# Patient Record
Sex: Female | Born: 1985 | Race: White | Hispanic: No | Marital: Married | Smoking: Never smoker
Health system: Southern US, Community
[De-identification: ages and names within clinical notes are randomized; demographics above are authoritative.]

## PROBLEM LIST (undated history)

## (undated) DIAGNOSIS — G4730 Sleep apnea, unspecified: Secondary | ICD-10-CM

## (undated) DIAGNOSIS — K760 Fatty (change of) liver, not elsewhere classified: Secondary | ICD-10-CM

## (undated) DIAGNOSIS — R002 Palpitations: Secondary | ICD-10-CM

## (undated) DIAGNOSIS — K219 Gastro-esophageal reflux disease without esophagitis: Secondary | ICD-10-CM

## (undated) DIAGNOSIS — K900 Celiac disease: Secondary | ICD-10-CM

## (undated) DIAGNOSIS — E282 Polycystic ovarian syndrome: Secondary | ICD-10-CM

## (undated) DIAGNOSIS — Z973 Presence of spectacles and contact lenses: Secondary | ICD-10-CM

## (undated) DIAGNOSIS — K5792 Diverticulitis of intestine, part unspecified, without perforation or abscess without bleeding: Secondary | ICD-10-CM

## (undated) DIAGNOSIS — K029 Dental caries, unspecified: Secondary | ICD-10-CM

## (undated) HISTORY — PX: DILATION AND CURETTAGE OF UTERUS: SHX78

## (undated) HISTORY — PX: KIDNEY SURGERY: SHX687

## (undated) HISTORY — PX: HERNIA REPAIR: SHX51

## (undated) HISTORY — PX: CHOLECYSTECTOMY: SHX55

---

## 2017-08-24 ENCOUNTER — Encounter (HOSPITAL_BASED_OUTPATIENT_CLINIC_OR_DEPARTMENT_OTHER): Payer: Self-pay

## 2017-08-24 ENCOUNTER — Emergency Department (HOSPITAL_BASED_OUTPATIENT_CLINIC_OR_DEPARTMENT_OTHER): Payer: No Typology Code available for payment source

## 2017-08-24 ENCOUNTER — Emergency Department (HOSPITAL_BASED_OUTPATIENT_CLINIC_OR_DEPARTMENT_OTHER)
Admission: EM | Admit: 2017-08-24 | Discharge: 2017-08-25 | Disposition: A | Discharge status: Home / Self Care | Payer: No Typology Code available for payment source | Attending: Emergency Medicine | Admitting: Emergency Medicine

## 2017-08-24 ENCOUNTER — Other Ambulatory Visit: Payer: Self-pay

## 2017-08-24 DIAGNOSIS — R1084 Generalized abdominal pain: Secondary | ICD-10-CM | POA: Diagnosis present

## 2017-08-24 DIAGNOSIS — R079 Chest pain, unspecified: Secondary | ICD-10-CM | POA: Diagnosis not present

## 2017-08-24 DIAGNOSIS — K429 Umbilical hernia without obstruction or gangrene: Secondary | ICD-10-CM

## 2017-08-24 HISTORY — DX: Diverticulitis of intestine, part unspecified, without perforation or abscess without bleeding: K57.92

## 2017-08-24 HISTORY — DX: Sleep apnea, unspecified: G47.30

## 2017-08-24 HISTORY — DX: Celiac disease: K90.0

## 2017-08-24 HISTORY — DX: Morbid (severe) obesity due to excess calories: E66.01

## 2017-08-24 HISTORY — DX: Fatty (change of) liver, not elsewhere classified: K76.0

## 2017-08-24 HISTORY — DX: Gastro-esophageal reflux disease without esophagitis: K21.9

## 2017-08-24 LAB — PREGNANCY, URINE: Preg Test, Ur: NEGATIVE

## 2017-08-24 LAB — CBC WITH DIFFERENTIAL/PLATELET
Basophils Absolute: 0 10*3/uL (ref 0.0–0.1)
Basophils Relative: 0 %
Eosinophils Absolute: 0.3 10*3/uL (ref 0.0–0.7)
Eosinophils Relative: 2 %
HEMATOCRIT: 39.5 % (ref 36.0–46.0)
Hemoglobin: 12.6 g/dL (ref 12.0–15.0)
LYMPHS PCT: 23 %
Lymphs Abs: 3.1 10*3/uL (ref 0.7–4.0)
MCH: 28.9 pg (ref 26.0–34.0)
MCHC: 31.9 g/dL (ref 30.0–36.0)
MCV: 90.6 fL (ref 78.0–100.0)
MONOS PCT: 6 %
Monocytes Absolute: 0.8 10*3/uL (ref 0.1–1.0)
Neutro Abs: 9 10*3/uL — ABNORMAL HIGH (ref 1.7–7.7)
Neutrophils Relative %: 69 %
Platelets: 461 10*3/uL — ABNORMAL HIGH (ref 150–400)
RBC: 4.36 MIL/uL (ref 3.87–5.11)
RDW: 14.9 % (ref 11.5–15.5)
WBC: 13.3 10*3/uL — ABNORMAL HIGH (ref 4.0–10.5)

## 2017-08-24 LAB — URINALYSIS, ROUTINE W REFLEX MICROSCOPIC
Bilirubin Urine: NEGATIVE
GLUCOSE, UA: NEGATIVE mg/dL
KETONES UR: NEGATIVE mg/dL
Nitrite: NEGATIVE
PH: 6.5 (ref 5.0–8.0)
Protein, ur: NEGATIVE mg/dL
Specific Gravity, Urine: 1.015 (ref 1.005–1.030)

## 2017-08-24 LAB — COMPREHENSIVE METABOLIC PANEL
ALBUMIN: 3.3 g/dL — AB (ref 3.5–5.0)
ALT: 19 U/L (ref 14–54)
AST: 21 U/L (ref 15–41)
Alkaline Phosphatase: 67 U/L (ref 38–126)
Anion gap: 9 (ref 5–15)
BILIRUBIN TOTAL: 0.3 mg/dL (ref 0.3–1.2)
BUN: 13 mg/dL (ref 6–20)
CHLORIDE: 102 mmol/L (ref 101–111)
CO2: 27 mmol/L (ref 22–32)
Calcium: 9.3 mg/dL (ref 8.9–10.3)
Creatinine, Ser: 0.63 mg/dL (ref 0.44–1.00)
GFR calc Af Amer: >60 mL/min (ref >60)
GFR calc non Af Amer: >60 mL/min (ref >60)
GLUCOSE: 101 mg/dL — AB (ref 65–99)
POTASSIUM: 4.1 mmol/L (ref 3.5–5.1)
Sodium: 138 mmol/L (ref 135–145)
Total Protein: 7.5 g/dL (ref 6.5–8.1)

## 2017-08-24 LAB — D-DIMER, QUANTITATIVE: D-Dimer, Quant: <0.27 ug/mL-FEU (ref 0.00–0.50)

## 2017-08-24 LAB — TROPONIN I: Troponin I: <0.03 ng/mL (ref <0.03)

## 2017-08-24 LAB — URINALYSIS, MICROSCOPIC (REFLEX)

## 2017-08-24 LAB — LIPASE, BLOOD: Lipase: 20 U/L (ref 11–51)

## 2017-08-24 MED ORDER — FENTANYL CITRATE (PF) 100 MCG/2ML IJ SOLN
50.0000 ug | Freq: Once | INTRAMUSCULAR | Status: AC
  Administered 2017-08-24: 50 ug via INTRAVENOUS
  Filled 2017-08-24: qty 2

## 2017-08-24 MED ORDER — MORPHINE SULFATE (PF) 4 MG/ML IV SOLN
4.0000 mg | Freq: Once | INTRAVENOUS | Status: AC
  Administered 2017-08-24: 4 mg via INTRAVENOUS
  Filled 2017-08-24: qty 1

## 2017-08-24 MED ORDER — SODIUM CHLORIDE 0.9 % IV BOLUS (SEPSIS)
1000.0000 mL | Freq: Once | INTRAVENOUS | Status: AC
  Administered 2017-08-24: 1000 mL via INTRAVENOUS

## 2017-08-24 NOTE — ED Notes (Signed)
Patient transported to X-ray 

## 2017-08-24 NOTE — ED Triage Notes (Signed)
C/o abd pain started yesterday-denies n/v/d-denies urinary sx-also c/o CP x 2 months-pt states she is on second heart monitor-NAD-steady gait

## 2017-08-24 NOTE — ED Provider Notes (Signed)
[facility] EMERGENCY DEPARTMENT Provider Note   CSN: [id number] Arrival date & time: 08/24/17  2007     History   Chief Complaint Chief Complaint  Patient presents with  . Abdominal Pain    HPI Teshara Moree is a 32 y.o. female past medical history of celiac, diverticulitis, GERD, fatty liver who presents for evaluation of abdominal pain and chest pain.  Patient reports that chest pain has been an ongoing issue for the last several months.  She states that she has been evaluated by cardiologist and has been put on a monitor for evaluation of any abnormalities.  Patient reports that she most recently has had some chest tightness that began today. No associated diaphoresis or SOB.  Patient also reports abdominal pain that began 2 days ago.  She states that the abdominal pain is all over and states that it feels like "someone is gutting me."  She states that she has taken ibuprofen with minimal improvement.  She is also attempted to take Tums with no improvement in pain.  She does state that pain is worsened after eating.  She denies any other alleviating or aggravating factors.  Patient reports that she has felt nauseous but denies any vomiting.  Patient reports that she has had some episodes of diarrhea but denies any blood present in the stool.  Patient states that she recently had a ultrasound that told her she had an enlarged fallopian tube.  Patient denies any fevers, dysuria, hematuria.  The history is provided by the patient.    Past Medical History:  Diagnosis Date  . Celiac disease   . Diverticulitis   . Fatty liver   . GERD (gastroesophageal reflux disease)   . Morbid obesity ([place])   . Sleep apnea     There are no active problems to display for this patient.   Past Surgical History:  Procedure Laterality Date  . CESAREAN SECTION    . KIDNEY SURGERY      OB History    No data available       Home Medications    Prior to Admission medications     Medication Sig Start Date End Date Taking? Authorizing Provider  Omeprazole (PRILOSEC PO) Take by mouth.   Yes [provider]    Family History No family history on file.  Social History Social History   Tobacco Use  . Smoking status: Never Smoker  . Smokeless tobacco: Never Used  Substance Use Topics  . Alcohol use: No    Frequency: Never  . Drug use: No     Allergies   Gluten meal   Review of Systems Review of Systems  Constitutional: Negative for fever.  Respiratory: Negative for cough and shortness of breath.   Cardiovascular: Positive for chest pain.  Gastrointestinal: Positive for abdominal pain, diarrhea and nausea. Negative for blood in stool and vomiting.  Genitourinary: Negative for dysuria and hematuria.  Neurological: Negative for headaches.     Physical Exam Updated Vital Signs BP (!) 158/111   Pulse 81   Temp 98.2 F (36.8 C) (Oral)   Resp (!) 22   Ht 5' 1"  (1.549 m)   Wt (!) 145.2 kg (320 lb)   LMP 08/10/2017   SpO2 96%   BMI 60.46 kg/m   Physical Exam  Constitutional: She is oriented to person, place, and time. She appears well-developed and well-nourished.  Appears uncomfortable but no acute distress   HENT:  Head: Normocephalic and atraumatic.  Mouth/Throat: Oropharynx  is clear and moist and mucous membranes are normal.  Eyes: Conjunctivae, EOM and lids are normal. Pupils are equal, round, and reactive to light.  Neck: Full passive range of motion without pain.  Cardiovascular: Normal rate, regular rhythm, normal heart sounds and normal pulses. Exam reveals no gallop and no friction rub.  No murmur heard. Pulses:      Radial pulses are 2+ on the right side, and 2+ on the left side.  Pulmonary/Chest: Effort normal and breath sounds normal.  No evidence of respiratory distress. Able to speak in full sentences without difficulty.  Abdominal: Soft. Normal appearance and bowel sounds are normal. She exhibits no distension and no  mass. There is generalized tenderness. There is no rigidity, no guarding, no CVA tenderness, no tenderness at McBurney's point and negative Murphy's sign. No hernia.  Abdomen is soft, nondistended.  No mass or hernia. No rigidity or guarding.  Patient does have some generalized tenderness most notably to the epigastric and right upper quadrant, though no Murphy sign.  No McBurney's point tenderness.  No CVA tenderness bilaterally.  Musculoskeletal: Normal range of motion.  Neurological: She is alert and oriented to person, place, and time.  Skin: Skin is warm and dry. Capillary refill takes less than 2 seconds.  Psychiatric: She has a normal mood and affect. Her speech is normal.  Nursing note and vitals reviewed.    ED Treatments / Results  Labs (all labs ordered are listed, but only abnormal results are displayed) Labs Reviewed  URINALYSIS, ROUTINE W REFLEX MICROSCOPIC - Abnormal; Notable for the following components:      Result Value   Hgb urine dipstick SMALL (*)    Leukocytes, UA TRACE (*)    All other components within normal limits  URINALYSIS, MICROSCOPIC (REFLEX) - Abnormal; Notable for the following components:   Bacteria, UA RARE (*)    Squamous Epithelial / LPF 0-5 (*)    All other components within normal limits  COMPREHENSIVE METABOLIC PANEL - Abnormal; Notable for the following components:   Glucose, Bld 101 (*)    Albumin 3.3 (*)    All other components within normal limits  CBC WITH DIFFERENTIAL/PLATELET - Abnormal; Notable for the following components:   WBC 13.3 (*)    Platelets 461 (*)    Neutro Abs 9.0 (*)    All other components within normal limits  PREGNANCY, URINE  LIPASE, BLOOD  TROPONIN I  D-DIMER, QUANTITATIVE (NOT AT [facility])    EKG  EKG Interpretation  Date/Time:  Tuesday August 24 2017 20:25:58 EST Ventricular Rate:  112 PR Interval:  152 QRS Duration: 98 QT Interval:  320 QTC Calculation: 436 R Axis:   75 Text Interpretation:  Sinus  tachycardia Incomplete right bundle branch block Borderline ECG No old tracing to compare Confirmed by Delora Fuel ([id number]) on 08/24/2017 11:23:15 PM       Radiology Dg Chest 2 View  Result Date: 08/25/2017 CLINICAL DATA:  Left-sided chest pain for 1 day. EXAM: CHEST  2 VIEW COMPARISON:  None. FINDINGS: The cardiomediastinal contours are normal. The lungs are clear. Pulmonary vasculature is normal. No consolidation, pleural effusion, or pneumothorax. No acute osseous abnormalities are seen. IMPRESSION: No acute pulmonary process. Electronically Signed   By: Jeb Levering M.D.   On: 08/25/2017 00:12    Procedures Procedures (including critical care time)  Medications Ordered in ED Medications  ondansetron (ZOFRAN) 4 MG/2ML injection (not administered)  sodium chloride 0.9 % bolus 1,000 mL (0 mLs Intravenous Stopped  08/25/17 0002)  morphine 4 MG/ML injection 4 mg (4 mg Intravenous Given 08/24/17 2305)  fentaNYL (SUBLIMAZE) injection 50 mcg (50 mcg Intravenous Given 08/24/17 2345)  HYDROmorphone (DILAUDID) injection 1 mg (1 mg Intravenous Given 08/25/17 0100)  ondansetron (ZOFRAN) injection 4 mg (4 mg Intravenous Given 08/25/17 0100)     Initial Impression / Assessment and Plan / ED Course  I have reviewed the triage vital signs and the nursing notes.  Pertinent labs & imaging results that were available during my care of the patient were reviewed by me and considered in my medical decision making (see chart for details).     32 y.o. F past medical history of GERD, diverticulitis, celiac who presents for evaluation of 2 days of generalized abdominal pain.  Patient does not report any focal point of tenderness but "it hurts all over."  Associated with nausea.  No fevers, vomiting.  Also developed some chest tightness earlier today.  Patient reports that she has intermittent chest tightness that is been ongoing for the last 2 months.  She is being evaluated by cardiology.  Patient is afebrile,  non-toxic appearing. Appears uncomfortable but no acute distress. Vital signs reviewed and stable.  Consider acute infectious etiology versus diverticulitis.  Low suspicion for appendicitis given history/physical exam, though a consideration.  Do not suspect ovarian torsion given the duration of symptoms.  Low suspicion for ACS etiology but also consideration given complaints of chest pain.  Low suspicion for PE though patient is tachycardic and tachypneic.  Plan to check basic labs. Analgesics provided in the department. IVF given for fluid resuscitation.   RN informed me that patient was still having pain despite morphine.  Will give additional analgesics  Labs reviewed.  Troponin negative.  Lipase unremarkable.  CMP shows slight hyperglycemia but otherwise unremarkable.  D-dimer is negative.  CBC with slight leukocytosis but otherwise unremarkable.  UA shows some hemoglobin but otherwise no acute signs of infection.  Urine pregnancy is negative.  Chest x-ray negative for any acute infectious etiology.  Discussed results with patient.  Patient is still having significant pain after 2 rounds of analgesics.  On reevaluation, the pain is localized more to the left upper quadrant and left side.  Given concerns for continued pain despite analgesics, will plan for CT abdomen pelvis evaluation.  At this time, the CT scanner at our department is not working.  Given concerns that patient needs further imaging, will plan to transfer her to [facility] ED for further evaluation.  Patient had an episode of vomiting here in the department.  Will give additional analgesics and antiemetics.  Discussed with Dr. Stark Jock.  He will accept patient for transfer to [facility] long emergency department for further CT abdomen pelvis evaluation.  Updated patient on plan.  She is agreeable.   Final Clinical Impressions(s) / ED Diagnoses   Final diagnoses:  Generalized abdominal pain    ED Discharge Orders    None         Desma Mcgregor 08/25/17 0247    Fatima Blank, MD 08/26/17 1925

## 2017-08-25 ENCOUNTER — Emergency Department (HOSPITAL_COMMUNITY): Payer: No Typology Code available for payment source

## 2017-08-25 ENCOUNTER — Encounter (HOSPITAL_COMMUNITY): Payer: Self-pay

## 2017-08-25 ENCOUNTER — Encounter: Payer: Self-pay | Admitting: Gastroenterology

## 2017-08-25 MED ORDER — HYDROCODONE-ACETAMINOPHEN 5-325 MG PO TABS: 1.0000 | ORAL_TABLET | ORAL | 0 refills | Status: DC | PRN

## 2017-08-25 MED ORDER — HYDROMORPHONE HCL 1 MG/ML IJ SOLN
1.0000 mg | Freq: Once | INTRAMUSCULAR | Status: AC
  Administered 2017-08-25: 1 mg via INTRAVENOUS
  Filled 2017-08-25: qty 1

## 2017-08-25 MED ORDER — ONDANSETRON HCL 4 MG/2ML IJ SOLN
4.0000 mg | Freq: Once | INTRAMUSCULAR | Status: AC
  Administered 2017-08-25: 4 mg via INTRAVENOUS

## 2017-08-25 MED ORDER — ONDANSETRON 4 MG PO TBDP: 4.0000 mg | ORAL_TABLET | Freq: Three times a day (TID) | ORAL | 0 refills | Status: DC | PRN

## 2017-08-25 MED ORDER — ONDANSETRON HCL 4 MG/2ML IJ SOLN
INTRAMUSCULAR | Status: AC
  Filled 2017-08-25: qty 2

## 2017-08-25 MED ORDER — IOPAMIDOL (ISOVUE-300) INJECTION 61%
INTRAVENOUS | Status: AC
  Administered 2017-08-25: 100 mL
  Filled 2017-08-25: qty 100

## 2017-08-25 NOTE — Discharge Instructions (Signed)
Follow up with your GI doctor for further evaluation and management of abdominal pain.

## 2017-08-25 NOTE — ED Provider Notes (Signed)
[facility] abdominal pain. She started having generalized abdominal pain 2 days ago described as sharp/stabbing. No modifying factors. No fever. Nonbloody bowel movements. No urinary symptoms.   VSS on arrival here, patient's pain is controlled. No vomiting in route. No vomiting since arrival here. She is up to the bathroom without distress.   CT scan showing fat-containing umbilical hernia with small bowel and without SB inflammation or obstruction. Will refer to surgery. Symptoms appear controlled now. VS remain unchanged.      Elpidio AnisUpstill, Markeria Goetsch, PA-C 08/25/17 [id number]    Geoffery Lyonselo, [place], MD 08/25/17 [phone]

## 2017-09-24 ENCOUNTER — Encounter: Payer: Self-pay | Admitting: Gastroenterology

## 2017-09-24 ENCOUNTER — Ambulatory Visit (INDEPENDENT_AMBULATORY_CARE_PROVIDER_SITE_OTHER): Payer: No Typology Code available for payment source | Admitting: Gastroenterology

## 2017-09-24 ENCOUNTER — Encounter (INDEPENDENT_AMBULATORY_CARE_PROVIDER_SITE_OTHER): Payer: Self-pay

## 2017-09-24 VITALS — BP 132/80 | HR 82 | Ht 61.0 in | Wt 332.0 lb

## 2017-09-24 DIAGNOSIS — G43A0 Cyclical vomiting, not intractable: Secondary | ICD-10-CM | POA: Diagnosis not present

## 2017-09-24 DIAGNOSIS — R1115 Cyclical vomiting syndrome unrelated to migraine: Secondary | ICD-10-CM

## 2017-09-24 DIAGNOSIS — K8020 Calculus of gallbladder without cholecystitis without obstruction: Secondary | ICD-10-CM | POA: Diagnosis not present

## 2017-09-24 DIAGNOSIS — K429 Umbilical hernia without obstruction or gangrene: Secondary | ICD-10-CM | POA: Diagnosis not present

## 2017-09-24 DIAGNOSIS — R1084 Generalized abdominal pain: Secondary | ICD-10-CM

## 2017-09-24 NOTE — H&P (View-Only) (Signed)
[facility] Consult Note:  History: Paige [name] 09/24/2017  Referring physician: Self-referred  Reason for consult/chief complaint: Abdominal Pain (symptoms are better after vomiting undigested food. Sx have been for 2 months)   Subjective  HPI:  This is a 32 year old woman self referred prior to a recent emergency department visit.  She had had intermittent chest tightness for a few months that had apparently been evaluated by cardiology.  A month ago [facility] with continued chest pain but also acute onset generalized abdominal pain that has bothered her for 2 days with no relief.  She was then transferred to the hospital emergency department, underwent CT abdomen and pelvis that showed fat and small bowel containing umbilical hernia and she was referred to surgery.  Netha reports an underlying diagnosis of celiac sprue made several years ago by a GI physician in [place].  She has been on a gluten-free diet since then, which resolved the chronic diarrhea she was previously experiencing.  Regarding the abdominal pain, she reports it has been going on for about 2 months.  She describes it as acute onset and over the entire abdomen but sometimes more so on the left side.  When it occurs it is "on speed [place]" and then leads to vomiting of undigested food.  When the episodes resolve, she is back to her baseline without abdominal pain nausea or vomiting.  She reports having seen a Psychologist, sport and exercise in [place] in 2017 for hernia, and recalls being told they would not do the surgery due to her weight.  She says she was not having abdominal pain at that time however.+ She denies dysphagia or odynophagia weight change.  ROS:  Review of Systems  Constitutional: Negative for appetite change and unexpected weight change.  HENT: Negative for mouth sores and voice change.   Eyes: Negative for pain and redness.  Respiratory: Negative for cough and shortness  of breath.   Cardiovascular: Positive for leg swelling. Negative for chest pain and palpitations.       Chronic bilateral peripheral edema  Genitourinary: Negative for dysuria and hematuria.  Musculoskeletal: Negative for arthralgias and myalgias.  Skin: Negative for pallor and rash.  Neurological: Negative for weakness and headaches.  Hematological: Negative for adenopathy.     Past Medical History: Past Medical History:  Diagnosis Date  . Celiac disease   . Diverticulitis   . Fatty liver   . GERD (gastroesophageal reflux disease)   . Morbid obesity ([place])   . Sleep apnea      Past Surgical History: Past Surgical History:  Procedure Laterality Date  . CESAREAN SECTION    . KIDNEY SURGERY       Family History: Family History  Problem Relation Age of Onset  . Diabetes Mother   . Prostate cancer Father   . Diabetes Sister     Social History: Social History   Socioeconomic History  . Marital status: Married    Spouse name: None  . Number of children: None  . Years of education: None  . Highest education level: None  Social Needs  . Financial resource strain: None  . Food insecurity - worry: None  . Food insecurity - inability: None  . Transportation needs - medical: None  . Transportation needs - non-medical: None  Occupational History  . None  Tobacco Use  . Smoking status: Never Smoker  . Smokeless tobacco: Never Used  Substance and Sexual Activity  . Alcohol use: No  Frequency: Never  . Drug use: No  . Sexual activity: None  Other Topics Concern  . None  Social History Narrative  . None    Allergies: Allergies  Allergen Reactions  . Gluten Meal     Outpatient Meds: Current Outpatient Medications  Medication Sig Dispense Refill  . HYDROcodone-acetaminophen (NORCO/VICODIN) 5-325 MG tablet Take 1-2 tablets by mouth every 4 (four) hours as needed. 8 tablet 0  . metoprolol tartrate (LOPRESSOR) 25 MG tablet Take 25 mg by mouth 2 (two) times  daily.    . ondansetron (ZOFRAN ODT) 4 MG disintegrating tablet Take 1 tablet (4 mg total) by mouth every 8 (eight) hours as needed for nausea or vomiting. 20 tablet 0  . pantoprazole (PROTONIX) 40 MG tablet Take 40 mg by mouth daily.     No current facility-administered medications for this visit.       ___________________________________________________________________ Objective   Exam:  BP 132/80   Pulse 82   Ht 5' 1"  (1.549 m)   Wt (!) 332 lb (150.6 kg)   BMI 62.73 kg/m   She is accompanied by her mother for the entire encounter   General: this is a(n) morbidly obese woman in no acute distress  Eyes: sclera anicteric, no redness  ENT: oral mucosa moist without lesions, no cervical or supraclavicular lymphadenopathy, good dentition  CV: RRR without murmur, S1/S2, no JVD, no peripheral edema  Resp: clear to auscultation bilaterally, normal RR and effort noted  GI: soft, no tenderness, with active bowel sounds. No guarding or palpable organomegaly noted exam s significantly limited by BMI; no hernia felt.  Skin; warm and dry, no rash or jaundice noted  Neuro: awake, alert and oriented x 3. Normal gross motor function and fluent speech  Labs:  CBC Latest Ref Rng & Units 08/24/2017  WBC 4.0 - 10.5 K/uL 13.3(H)  Hemoglobin 12.0 - 15.0 g/dL 12.6  Hematocrit 36.0 - 46.0 % 39.5  Platelets 150 - 400 K/uL 461(H)   CMP Latest Ref Rng & Units 08/24/2017  Glucose 65 - 99 mg/dL 101(H)  BUN 6 - 20 mg/dL 13  Creatinine 0.44 - 1.00 mg/dL 0.63  Sodium 135 - 145 mmol/L 138  Potassium 3.5 - 5.1 mmol/L 4.1  Chloride 101 - 111 mmol/L 102  CO2 22 - 32 mmol/L 27  Calcium 8.9 - 10.3 mg/dL 9.3  Total Protein 6.5 - 8.1 g/dL 7.5  Total Bilirubin 0.3 - 1.2 mg/dL 0.3  Alkaline Phos 38 - 126 U/L 67  AST 15 - 41 U/L 21  ALT 14 - 54 U/L 19     Radiologic Studies:  CTAP 08/25/16 report:  IMPRESSION: 1. Moderate-sized umbilical hernia contains small bowel without associated small  bowel thickening or obstruction. There is mild stranding of the herniated fat which may reflect inflammatory change. 2. Hepatosplenomegaly and hepatic steatosis.  A small gallstone was also noted in report but not included in final impression  I personally reviewed the CT images and showed them to the patient. She and her mother were upset because they do not recall any results being conveyed to the mother than that "nothing was found". Assessment: Encounter Diagnoses  Name Primary?  . Non-intractable cyclical vomiting with nausea Yes  . Umbilical hernia without obstruction and without gangrene   . Generalized abdominal pain   . Calculus of gallbladder without cholecystitis without obstruction     Her symptoms sound most like intermittent obstruction from this umbilical hernia.  There is some small bowel and fat  contained in it with some minor stranding.  There was no proximal dilatation on the CT scan however.  I think less likely explanation is gastric outlet obstruction or other upper GI pathology. It does not sound typical for biliary colic, but that is still possible   Plan:  Upper endoscopy.  Must be done next week at the hospital endoscopy lab due to her BMI.  The benefits and risks of the planned procedure were described in detail with the patient or (when appropriate) their health care proxy.  Risks were outlined as including, but not limited to, bleeding, infection, perforation, adverse medication reaction leading to cardiac or pulmonary decompensation, or pancreatitis (if ERCP).  The limitation of incomplete mucosal visualization was also discussed.  No guarantees or warranties were given.  Patient at increased risk for cardiopulmonary complications of procedure due to medical comorbidities.  Referral to general surgery for consideration of hernia repair and possibly cholecystectomy as well.  She wished to see a different surgeon, so referral was placed to [facility]  surgery.  Thank you for the courtesy of this consult.  Please call me with any questions or concerns.  Nelida Meuse III  CC: Self-referred

## 2017-09-24 NOTE — Addendum Note (Signed)
Addended by: Nelida Meuse on: 09/24/2017 12:50 PM   Modules accepted: Level of Service

## 2017-09-24 NOTE — Progress Notes (Addendum)
[facility] Consult Note:  History: Paige [name] 09/24/2017  Referring physician: Self-referred  Reason for consult/chief complaint: Abdominal Pain (symptoms are better after vomiting undigested food. Sx have been for 2 months)   Subjective  HPI:  This is a 32 year old woman self referred prior to a recent emergency department visit.  She had had intermittent chest tightness for a few months that had apparently been evaluated by cardiology.  A month ago she [facility] [place] [facility] with continued chest pain but also acute onset generalized abdominal pain that has bothered her for 2 days with no relief.  She was then transferred to the hospital emergency department, underwent CT abdomen and pelvis that showed fat and small bowel containing umbilical hernia and she was referred to surgery.  Paige [name] reports an underlying diagnosis of celiac sprue made several years ago by a GI physician in [place].  She has been on a gluten-free diet since then, which resolved the chronic diarrhea she was previously experiencing.  Regarding the abdominal pain, she reports it has been going on for about 2 months.  She describes it as acute onset and over the entire abdomen but sometimes more so on the left side.  When it occurs it is "on speed [place]" and then leads to vomiting of undigested food.  When the episodes resolve, she is back to her baseline without abdominal pain nausea or vomiting.  She reports having seen a Psychologist, sport and exercise in [place] in 2017 for hernia, and recalls being told they would not do the surgery due to her weight.  She says she was not having abdominal pain at that time however.+ She denies dysphagia or odynophagia weight change.  ROS:  Review of Systems  Constitutional: Negative for appetite change and unexpected weight change.  HENT: Negative for mouth sores and voice change.   Eyes: Negative for pain and redness.  Respiratory: Negative for cough and shortness  of breath.   Cardiovascular: Positive for leg swelling. Negative for chest pain and palpitations.       Chronic bilateral peripheral edema  Genitourinary: Negative for dysuria and hematuria.  Musculoskeletal: Negative for arthralgias and myalgias.  Skin: Negative for pallor and rash.  Neurological: Negative for weakness and headaches.  Hematological: Negative for adenopathy.     Past Medical History: Past Medical History:  Diagnosis Date  . Celiac disease   . Diverticulitis   . Fatty liver   . GERD (gastroesophageal reflux disease)   . Morbid obesity ([place])   . Sleep apnea      Past Surgical History: Past Surgical History:  Procedure Laterality Date  . CESAREAN SECTION    . KIDNEY SURGERY       Family History: Family History  Problem Relation Age of Onset  . Diabetes Mother   . Prostate cancer Father   . Diabetes Sister     Social History: Social History   Socioeconomic History  . Marital status: Married    Spouse name: None  . Number of children: None  . Years of education: None  . Highest education level: None  Social Needs  . Financial resource strain: None  . Food insecurity - worry: None  . Food insecurity - inability: None  . Transportation needs - medical: None  . Transportation needs - non-medical: None  Occupational History  . None  Tobacco Use  . Smoking status: Never Smoker  . Smokeless tobacco: Never Used  Substance and Sexual Activity  . Alcohol use: No  Frequency: Never  . Drug use: No  . Sexual activity: None  Other Topics Concern  . None  Social History Narrative  . None    Allergies: Allergies  Allergen Reactions  . Gluten Meal     Outpatient Meds: Current Outpatient Medications  Medication Sig Dispense Refill  . HYDROcodone-acetaminophen (NORCO/VICODIN) 5-325 MG tablet Take 1-2 tablets by mouth every 4 (four) hours as needed. 8 tablet 0  . metoprolol tartrate (LOPRESSOR) 25 MG tablet Take 25 mg by mouth 2 (two) times  daily.    . ondansetron (ZOFRAN ODT) 4 MG disintegrating tablet Take 1 tablet (4 mg total) by mouth every 8 (eight) hours as needed for nausea or vomiting. 20 tablet 0  . pantoprazole (PROTONIX) 40 MG tablet Take 40 mg by mouth daily.     No current facility-administered medications for this visit.       ___________________________________________________________________ Objective   Exam:  BP 132/80   Pulse 82   Ht 5' 1"  (1.549 m)   Wt (!) 332 lb (150.6 kg)   BMI 62.73 kg/m   She is accompanied by her mother for the entire encounter   General: this is a(n) morbidly obese woman in no acute distress  Eyes: sclera anicteric, no redness  ENT: oral mucosa moist without lesions, no cervical or supraclavicular lymphadenopathy, good dentition  CV: RRR without murmur, S1/S2, no JVD, no peripheral edema  Resp: clear to auscultation bilaterally, normal RR and effort noted  GI: soft, no tenderness, with active bowel sounds. No guarding or palpable organomegaly noted exam s significantly limited by BMI; no hernia felt.  Skin; warm and dry, no rash or jaundice noted  Neuro: awake, alert and oriented x 3. Normal gross motor function and fluent speech  Labs:  CBC Latest Ref Rng & Units 08/24/2017  WBC 4.0 - 10.5 K/uL 13.3(H)  Hemoglobin 12.0 - 15.0 g/dL 12.6  Hematocrit 36.0 - 46.0 % 39.5  Platelets 150 - 400 K/uL 461(H)   CMP Latest Ref Rng & Units 08/24/2017  Glucose 65 - 99 mg/dL 101(H)  BUN 6 - 20 mg/dL 13  Creatinine 0.44 - 1.00 mg/dL 0.63  Sodium 135 - 145 mmol/L 138  Potassium 3.5 - 5.1 mmol/L 4.1  Chloride 101 - 111 mmol/L 102  CO2 22 - 32 mmol/L 27  Calcium 8.9 - 10.3 mg/dL 9.3  Total Protein 6.5 - 8.1 g/dL 7.5  Total Bilirubin 0.3 - 1.2 mg/dL 0.3  Alkaline Phos 38 - 126 U/L 67  AST 15 - 41 U/L 21  ALT 14 - 54 U/L 19     Radiologic Studies:  CTAP 08/25/16 report:  IMPRESSION: 1. Moderate-sized umbilical hernia contains small bowel without associated small  bowel thickening or obstruction. There is mild stranding of the herniated fat which may reflect inflammatory change. 2. Hepatosplenomegaly and hepatic steatosis.  A small gallstone was also noted in report but not included in final impression  I personally reviewed the CT images and showed them to the patient. She and her mother were upset because they do not recall any results being conveyed to the mother than that "nothing was found". Assessment: Encounter Diagnoses  Name Primary?  . Non-intractable cyclical vomiting with nausea Yes  . Umbilical hernia without obstruction and without gangrene   . Generalized abdominal pain   . Calculus of gallbladder without cholecystitis without obstruction     Her symptoms sound most like intermittent obstruction from this umbilical hernia.  There is some small bowel and fat  contained in it with some minor stranding.  There was no proximal dilatation on the CT scan however.  I think less likely explanation is gastric outlet obstruction or other upper GI pathology. It does not sound typical for biliary colic, but that is still possible   Plan:  Upper endoscopy.  Must be done next week at the hospital endoscopy lab due to her BMI.  The benefits and risks of the planned procedure were described in detail with the patient or (when appropriate) their health care proxy.  Risks were outlined as including, but not limited to, bleeding, infection, perforation, adverse medication reaction leading to cardiac or pulmonary decompensation, or pancreatitis (if ERCP).  The limitation of incomplete mucosal visualization was also discussed.  No guarantees or warranties were given.  Patient at increased risk for cardiopulmonary complications of procedure due to medical comorbidities.  Referral to general surgery for consideration of hernia repair and possibly cholecystectomy as well.  She wished to see a different surgeon, so referral was placed to [facility]  surgery.  Thank you for the courtesy of this consult.  Please call me with any questions or concerns.  Nelida Meuse III  CC: Self-referred

## 2017-09-24 NOTE — Patient Instructions (Signed)
If you are age 31 or older, your body mass index should be between 23-30. Your Body mass index is 62.73 kg/m. If this is out of the aforementioned range listed, please consider follow up with your Primary Care Provider.  If you are age 31 or younger, your body mass index should be between 19-25. Your Body mass index is 62.73 kg/m. If this is out of the aformentioned range listed, please consider follow up with your Primary Care Provider.   You have been scheduled for an endoscopy. Please follow written instructions given to you at your visit today. If you use inhalers (even only as needed), please bring them with you on the day of your procedure. Your physician has requested that you go to www.startemmi.com and enter the access code given to you at your visit today. This web site gives a general overview about your procedure. However, you should still follow specific instructions given to you by our office regarding your preparation for the procedure.  [facility].. Make certain to bring a list of current medications, including any over the counter medications or vitamins. Also bring your co-pay if you have one as well as your insurance cards. [facility] is located at [address], [address]. Should you need to reschedule your appointment, please contact them at [phone].  Thank you for choosing [facility]  Dr Amada JupiterHenry Danis III

## 2017-09-28 ENCOUNTER — Encounter (HOSPITAL_COMMUNITY)
Admission: RE | Disposition: A | Discharge status: Home / Self Care | Payer: Self-pay | Source: Ambulatory Visit | Attending: Gastroenterology

## 2017-09-28 ENCOUNTER — Ambulatory Visit (HOSPITAL_COMMUNITY): Payer: No Typology Code available for payment source | Admitting: Anesthesiology

## 2017-09-28 ENCOUNTER — Other Ambulatory Visit: Payer: Self-pay

## 2017-09-28 ENCOUNTER — Encounter (HOSPITAL_COMMUNITY): Payer: Self-pay | Admitting: *Deleted

## 2017-09-28 ENCOUNTER — Ambulatory Visit (HOSPITAL_COMMUNITY)
Admission: RE | Admit: 2017-09-28 | Discharge: 2017-09-28 | Disposition: A | Discharge status: Home / Self Care | Payer: No Typology Code available for payment source | Source: Ambulatory Visit | Attending: Gastroenterology | Admitting: Gastroenterology

## 2017-09-28 DIAGNOSIS — Z6841 Body Mass Index (BMI) 40.0 and over, adult: Secondary | ICD-10-CM | POA: Insufficient documentation

## 2017-09-28 DIAGNOSIS — R1110 Vomiting, unspecified: Secondary | ICD-10-CM

## 2017-09-28 DIAGNOSIS — R1010 Upper abdominal pain, unspecified: Secondary | ICD-10-CM | POA: Diagnosis not present

## 2017-09-28 DIAGNOSIS — K219 Gastro-esophageal reflux disease without esophagitis: Secondary | ICD-10-CM | POA: Diagnosis not present

## 2017-09-28 DIAGNOSIS — K429 Umbilical hernia without obstruction or gangrene: Secondary | ICD-10-CM

## 2017-09-28 DIAGNOSIS — K760 Fatty (change of) liver, not elsewhere classified: Secondary | ICD-10-CM | POA: Diagnosis not present

## 2017-09-28 DIAGNOSIS — Z79899 Other long term (current) drug therapy: Secondary | ICD-10-CM | POA: Insufficient documentation

## 2017-09-28 DIAGNOSIS — G4730 Sleep apnea, unspecified: Secondary | ICD-10-CM | POA: Diagnosis not present

## 2017-09-28 DIAGNOSIS — K900 Celiac disease: Secondary | ICD-10-CM | POA: Diagnosis not present

## 2017-09-28 DIAGNOSIS — R1115 Cyclical vomiting syndrome unrelated to migraine: Secondary | ICD-10-CM

## 2017-09-28 HISTORY — PX: ESOPHAGOGASTRODUODENOSCOPY: SHX5428

## 2017-09-28 SURGERY — EGD (ESOPHAGOGASTRODUODENOSCOPY)
Anesthesia: Monitor Anesthesia Care

## 2017-09-28 MED ORDER — LIDOCAINE 2% (20 MG/ML) 5 ML SYRINGE
INTRAMUSCULAR | Status: DC | PRN
  Administered 2017-09-28: 80 mg via INTRAVENOUS

## 2017-09-28 MED ORDER — LACTATED RINGERS IV SOLN
INTRAVENOUS | Status: DC
  Administered 2017-09-28: 09:00:00 via INTRAVENOUS

## 2017-09-28 MED ORDER — SODIUM CHLORIDE 0.9 % IV SOLN: INTRAVENOUS | Status: DC

## 2017-09-28 MED ORDER — PROPOFOL 10 MG/ML IV BOLUS
INTRAVENOUS | Status: AC
  Filled 2017-09-28: qty 20

## 2017-09-28 MED ORDER — KETAMINE HCL 10 MG/ML IJ SOLN
INTRAMUSCULAR | Status: DC | PRN
  Administered 2017-09-28 (×2): 10 mg via INTRAVENOUS

## 2017-09-28 MED ORDER — LACTATED RINGERS IV SOLN: INTRAVENOUS | Status: DC | PRN

## 2017-09-28 MED ORDER — KETAMINE HCL 10 MG/ML IJ SOLN
INTRAMUSCULAR | Status: AC
  Filled 2017-09-28: qty 1

## 2017-09-28 MED ORDER — PROPOFOL 10 MG/ML IV BOLUS
INTRAVENOUS | Status: DC | PRN
  Administered 2017-09-28 (×3): 50 mg via INTRAVENOUS

## 2017-09-28 NOTE — Anesthesia Preprocedure Evaluation (Addendum)
Anesthesia Evaluation  Patient identified by MRN, date of birth, ID band Patient awake    Reviewed: Allergy & Precautions, NPO status , Patient's Chart, lab work & pertinent test results  Airway Mallampati: II  TM Distance: >3 FB Neck ROM: Full    Dental no notable dental hx. (+) Dental Advisory Given, Chipped, Poor Dentition   Pulmonary sleep apnea and Continuous Positive Airway Pressure Ventilation ,    Pulmonary exam normal breath sounds clear to auscultation       Cardiovascular negative cardio ROS Normal cardiovascular exam Rhythm:Regular Rate:Normal     Neuro/Psych negative neurological ROS  negative psych ROS   GI/Hepatic negative GI ROS, Neg liver ROS,   Endo/Other  Morbid obesity  Renal/GU negative Renal ROS  negative genitourinary   Musculoskeletal negative musculoskeletal ROS (+)   Abdominal   Peds negative pediatric ROS (+)  Hematology negative hematology ROS (+)   Anesthesia Other Findings   Reproductive/Obstetrics negative OB ROS                           Anesthesia Physical Anesthesia Plan  ASA: III  Anesthesia Plan: MAC   Post-op Pain Management:    Induction: Intravenous  PONV Risk Score and Plan: 2 and Treatment may vary due to age or medical condition  Airway Management Planned: Nasal Cannula  Additional Equipment:   Intra-op Plan:   Post-operative Plan:   Informed Consent: I have reviewed the patients History and Physical, chart, labs and discussed the procedure including the risks, benefits and alternatives for the proposed anesthesia with the patient or authorized representative who has indicated his/her understanding and acceptance.   Dental advisory given  Plan Discussed with: CRNA  Anesthesia Plan Comments:         Anesthesia Quick Evaluation

## 2017-09-28 NOTE — H&P (Signed)
History:  This patient presents for endoscopic testing for abdominal pain, vomiting See office consult note from last week.  Debbra RidingHolly Glaze Referring physician: System, Pcp Not In  Past Medical History: Past Medical History:  Diagnosis Date  . Celiac disease   . Diverticulitis   . Fatty liver   . GERD (gastroesophageal reflux disease)   . Morbid obesity ([facility])   . Sleep apnea      Past Surgical History: Past Surgical History:  Procedure Laterality Date  . CESAREAN SECTION    . KIDNEY SURGERY      Allergies: Allergies  Allergen Reactions  . Gluten Meal Other (See Comments)    Celiac disease  . Latex Itching    Outpatient Meds: Current Facility-Administered Medications  Medication Dose Route Frequency Provider Last Rate Last Dose  . lactated ringers infusion   Intravenous Continuous Charlie Pitteranis, Alizee Maple L III, MD 20 mL/hr at 09/28/17 0919        ___________________________________________________________________ Objective   Exam:  BP (!) 142/99   Pulse 96   Temp 97.6 F (36.4 C) (Oral)   Resp 17   Ht 5\' 1"  (1.549 m)   Wt (!) 332 lb (150.6 kg)   LMP 09/24/2017 Comment: per dr Acey Lavcarignan ok with no pregnancy test  SpO2 98%   BMI 62.73 kg/m    CV: RRR without murmur, S1/S2, no JVD, no peripheral edema  Resp: clear to auscultation bilaterally, normal RR and effort noted  GI: soft,morbidly obese, no tenderness, with active bowel sounds. No guarding or palpable organomegaly noted.  Neuro: awake, alert and oriented x 3. Normal gross motor function and fluent speech   Assessment:  Abdominal pain, vomiting  Plan:  EGD   Charlie PitterHenry L Danis III

## 2017-09-28 NOTE — Interval H&P Note (Signed)
History and Physical Interval Note:  09/28/2017 10:25 AM  Paige [name]  has presented today for surgery, with the diagnosis of Vomiting  The various methods of treatment have been discussed with the patient and family. After consideration of risks, benefits and other options for treatment, the patient has consented to  Procedure(s): ESOPHAGOGASTRODUODENOSCOPY (EGD) (N/A) as a surgical intervention .  The patient's history has been reviewed, patient examined, no change in status, stable for surgery.  I have reviewed the patient's chart and labs.  Questions were answered to the patient's satisfaction.     Seerat Peaden L Danis III   

## 2017-09-28 NOTE — Discharge Instructions (Signed)
YOU HAD AN ENDOSCOPIC PROCEDURE TODAY: Refer to the procedure report and other information in the discharge instructions given to you for any specific questions about what was found during the examination. If this information does not answer your questions, please call [facility] office at [phone] to clarify.  ° °YOU SHOULD EXPECT: Some feelings of bloating in the abdomen. Passage of more gas than usual. Walking can help get rid of the air that was put into your GI tract during the procedure and reduce the bloating. If you had a lower endoscopy (such as a colonoscopy or flexible sigmoidoscopy) you may notice spotting of blood in your stool or on the toilet paper. Some abdominal soreness may be present for a day or two, also. ° °DIET: Your first meal following the procedure should be a light meal and then it is ok to progress to your normal diet. A half-sandwich or bowl of soup is an example of a good first meal. Heavy or fried foods are harder to digest and may make you feel nauseous or bloated. Drink plenty of fluids but you should avoid alcoholic beverages for 24 hours. If you had a esophageal dilation, please see attached instructions for diet.   ° °ACTIVITY: Your care partner should take you home directly after the procedure. You should plan to take it easy, moving slowly for the rest of the day. You can resume normal activity the day after the procedure however YOU SHOULD NOT DRIVE, use power tools, machinery or perform tasks that involve climbing or major physical exertion for 24 hours (because of the sedation medicines used during the test).  ° °SYMPTOMS TO REPORT IMMEDIATELY: °A gastroenterologist can be reached at any hour. Please call [phone]  for any of the following symptoms:  °Following lower endoscopy (colonoscopy, flexible sigmoidoscopy) °Excessive amounts of blood in the stool  °Significant tenderness, worsening of abdominal pains  °Swelling of the abdomen that is new, acute  °Fever of 100° or  higher  °Following upper endoscopy (EGD, EUS, ERCP, esophageal dilation) °Vomiting of blood or coffee ground material  °New, significant abdominal pain  °New, significant chest pain or pain under the shoulder blades  °Painful or persistently difficult swallowing  °New shortness of breath  °Black, tarry-looking or red, bloody stools ° °FOLLOW UP:  °If any biopsies were taken you will be contacted by phone or by letter within the next 1-3 weeks. Call [phone]  if you have not heard about the biopsies in 3 weeks.  °Please also call with any specific questions about appointments or follow up tests. ° °

## 2017-09-28 NOTE — Transfer of Care (Signed)
Immediate Anesthesia Transfer of Care Note  Patient: Paige [name]  Procedure(s) Performed: ESOPHAGOGASTRODUODENOSCOPY (EGD) (N/A )  Patient Location: PACU and Endoscopy Unit  Anesthesia Type:MAC  Level of Consciousness: awake and alert   Airway & Oxygen Therapy: Patient Spontanous Breathing and Patient connected to nasal cannula oxygen  Post-op Assessment: Report given to RN and Post -op Vital signs reviewed and stable  Post vital signs: Reviewed and stable  Last Vitals:  Vitals:   09/28/17 0910 09/28/17 1103  BP: (!) 142/99 133/81  Pulse: 96 97  Resp: 17 (!) 23  Temp: 36.4 C 36.6 C  SpO2: 98% 97%    Last Pain:  Vitals:   09/28/17 1103  TempSrc: Oral         Complications: No apparent anesthesia complications

## 2017-09-28 NOTE — Anesthesia Postprocedure Evaluation (Signed)
Anesthesia Post Note  Patient: Paige [name]  Procedure(s) Performed: ESOPHAGOGASTRODUODENOSCOPY (EGD) (N/A )     Patient location during evaluation: Endoscopy Anesthesia Type: MAC Level of consciousness: awake and alert Pain management: pain level controlled Vital Signs Assessment: post-procedure vital signs reviewed and stable Respiratory status: spontaneous breathing, nonlabored ventilation, respiratory function stable and patient connected to nasal cannula oxygen Cardiovascular status: stable and blood pressure returned to baseline Postop Assessment: no apparent nausea or vomiting Anesthetic complications: no    Last Vitals:  Vitals:   09/28/17 1110 09/28/17 1115  BP: 128/84 135/82  Pulse: 93 91  Resp: (!) 26 (!) 34  Temp:    SpO2: 100% 98%    Last Pain:  Vitals:   09/28/17 1103  TempSrc: Oral                 Montez Hageman

## 2017-09-28 NOTE — Anesthesia Procedure Notes (Signed)
Procedure Name: MAC Date/Time: 09/28/2017 10:40 AM Performed by: Sharlette Dense, CRNA Pre-anesthesia Checklist: Patient identified, Emergency Drugs available, Suction available, Patient being monitored and Timeout performed Patient Re-evaluated:Patient Re-evaluated prior to induction Oxygen Delivery Method: Nasal cannula Placement Confirmation: breath sounds checked- equal and bilateral and positive ETCO2 Dental Injury: Teeth and Oropharynx as per pre-operative assessment

## 2017-09-28 NOTE — Interval H&P Note (Signed)
History and Physical Interval Note:  09/28/2017 10:25 AM  Paige [name]  has presented today for surgery, with the diagnosis of Vomiting  The various methods of treatment have been discussed with the patient and family. After consideration of risks, benefits and other options for treatment, the patient has consented to  Procedure(s): ESOPHAGOGASTRODUODENOSCOPY (EGD) (N/A) as a surgical intervention .  The patient's history has been reviewed, patient examined, no change in status, stable for surgery.  I have reviewed the patient's chart and labs.  Questions were answered to the patient's satisfaction.     Nelida Meuse III

## 2017-09-28 NOTE — Op Note (Signed)
[facility] Patient Name: Paige [name] Procedure Date: 09/28/2017 MRN: [id number] Attending MD: Estill Cotta. Loletha Carrow , MD Date of Birth: [birth year] CSN: [id number] Age: 32 Admit Type: Outpatient Procedure:                Upper GI endoscopy Indications:              Upper abdominal pain, Vomiting (patient has a                            previous diagnosis of celiac sprue) Providers:                Estill Cotta. Loletha Carrow, MD, Cleda Daub, RN, William Dalton, Technician, Charolette Child, Technician Referring MD:              Medicines:                Monitored Anesthesia Care Complications:            No immediate complications. Estimated Blood Loss:     Estimated blood loss: none. Procedure:                Pre-Anesthesia Assessment:                           - Prior to the procedure, a History and Physical                            was performed, and patient medications and                            allergies were reviewed. The patient's tolerance of                            previous anesthesia was also reviewed. The risks                            and benefits of the procedure and the sedation                            options and risks were discussed with the patient.                            All questions were answered, and informed consent                            was obtained. Prior Anticoagulants: The patient has                            taken no previous anticoagulant or antiplatelet                            agents. ASA Grade Assessment: III - A patient with  severe systemic disease. After reviewing the risks                            and benefits, the patient was deemed in                            satisfactory condition to undergo the procedure.                           After obtaining informed consent, the endoscope was                            passed under direct vision. Throughout the              procedure, the patient's blood pressure, pulse, and                            oxygen saturations were monitored continuously. The                            ([id number]) [id number] was introduced through the                            mouth, and advanced to the second part of duodenum.                            The upper GI endoscopy was accomplished without                            difficulty. The patient tolerated the procedure. Findings:      The esophagus was normal.      The stomach was normal.      The cardia and gastric fundus were normal on retroflexion.      The examined duodenum was normal. Impression:               - Normal esophagus.                           - Normal stomach.                           - Normal examined duodenum. No gross endoscopic                            evidence of celiac sprue, most likely because                            patient is on a gluten free diet.                           - No specimens collected. Moderate Sedation:      MAC sedation used Recommendation:           - Patient has a contact number available for  emergencies. The signs and symptoms of potential                            delayed complications were discussed with the                            patient. Return to normal activities tomorrow.                            Written discharge instructions were provided to the                            patient.                           - Resume previous diet.                           - Continue present medications.                           - Surgical consultation as recently planned to                            evaluate umbilical hernia and gallstone. Procedure Code(s):        --- Professional ---                           [phone], Esophagogastroduodenoscopy, flexible,                            transoral; diagnostic, including collection of                            specimen(s) by brushing or  washing, when performed                            (separate procedure) Diagnosis Code(s):        --- Professional ---                           R10.10, Upper abdominal pain, unspecified                           R11.10, Vomiting, unspecified CPT copyright 2016 American Medical Association. All rights reserved. The codes documented in this report are preliminary and upon coder review may  be revised to meet current compliance requirements. Laiba Fuerte L. Loletha Carrow, MD 09/28/2017 10:54:37 AM This report has been signed electronically. Number of Addenda: 0

## 2017-09-29 ENCOUNTER — Encounter (HOSPITAL_COMMUNITY): Payer: Self-pay | Admitting: Gastroenterology

## 2019-01-24 IMAGING — DX DG CHEST 2V
2 series · 2 of 2 positions shown · non-contrast
Comparison: None.

CLINICAL DATA: Left-sided chest pain for 1 day.

EXAM:
CHEST  2 VIEW

[chest pa]
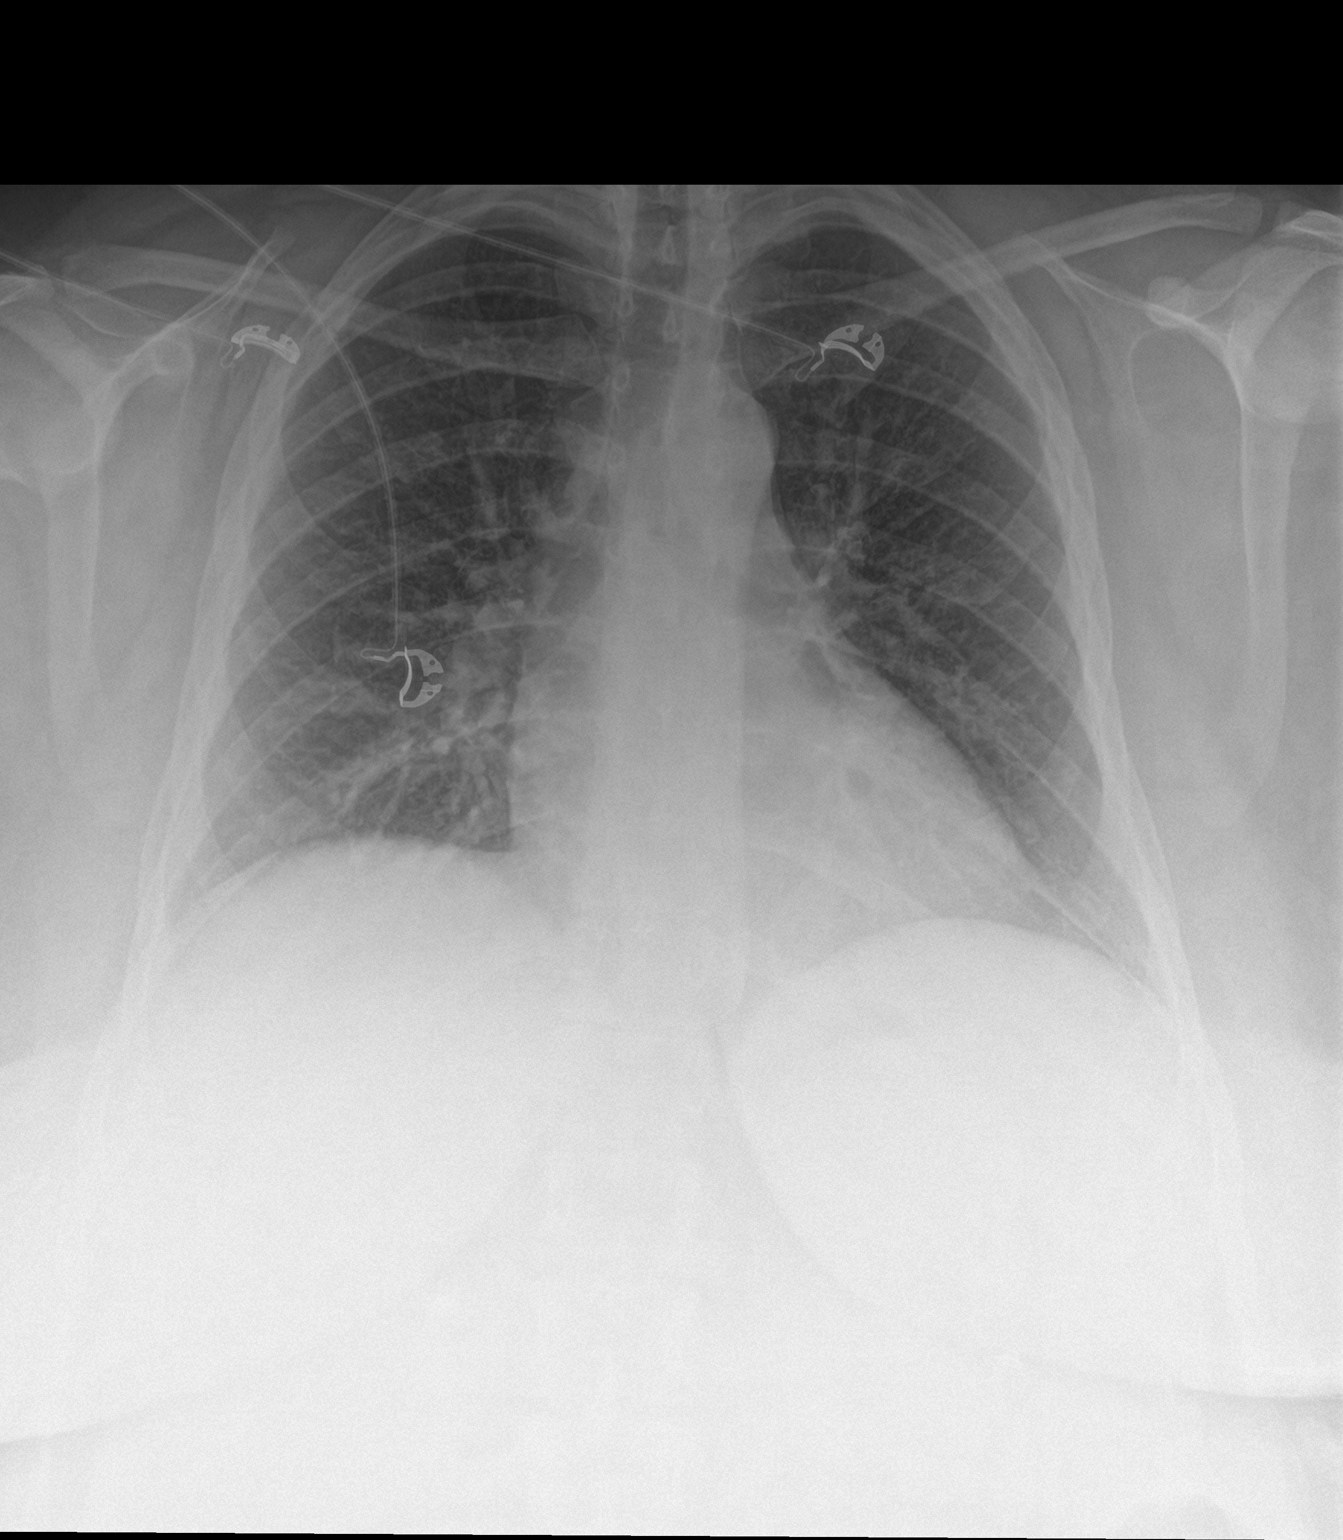

[chest lat]
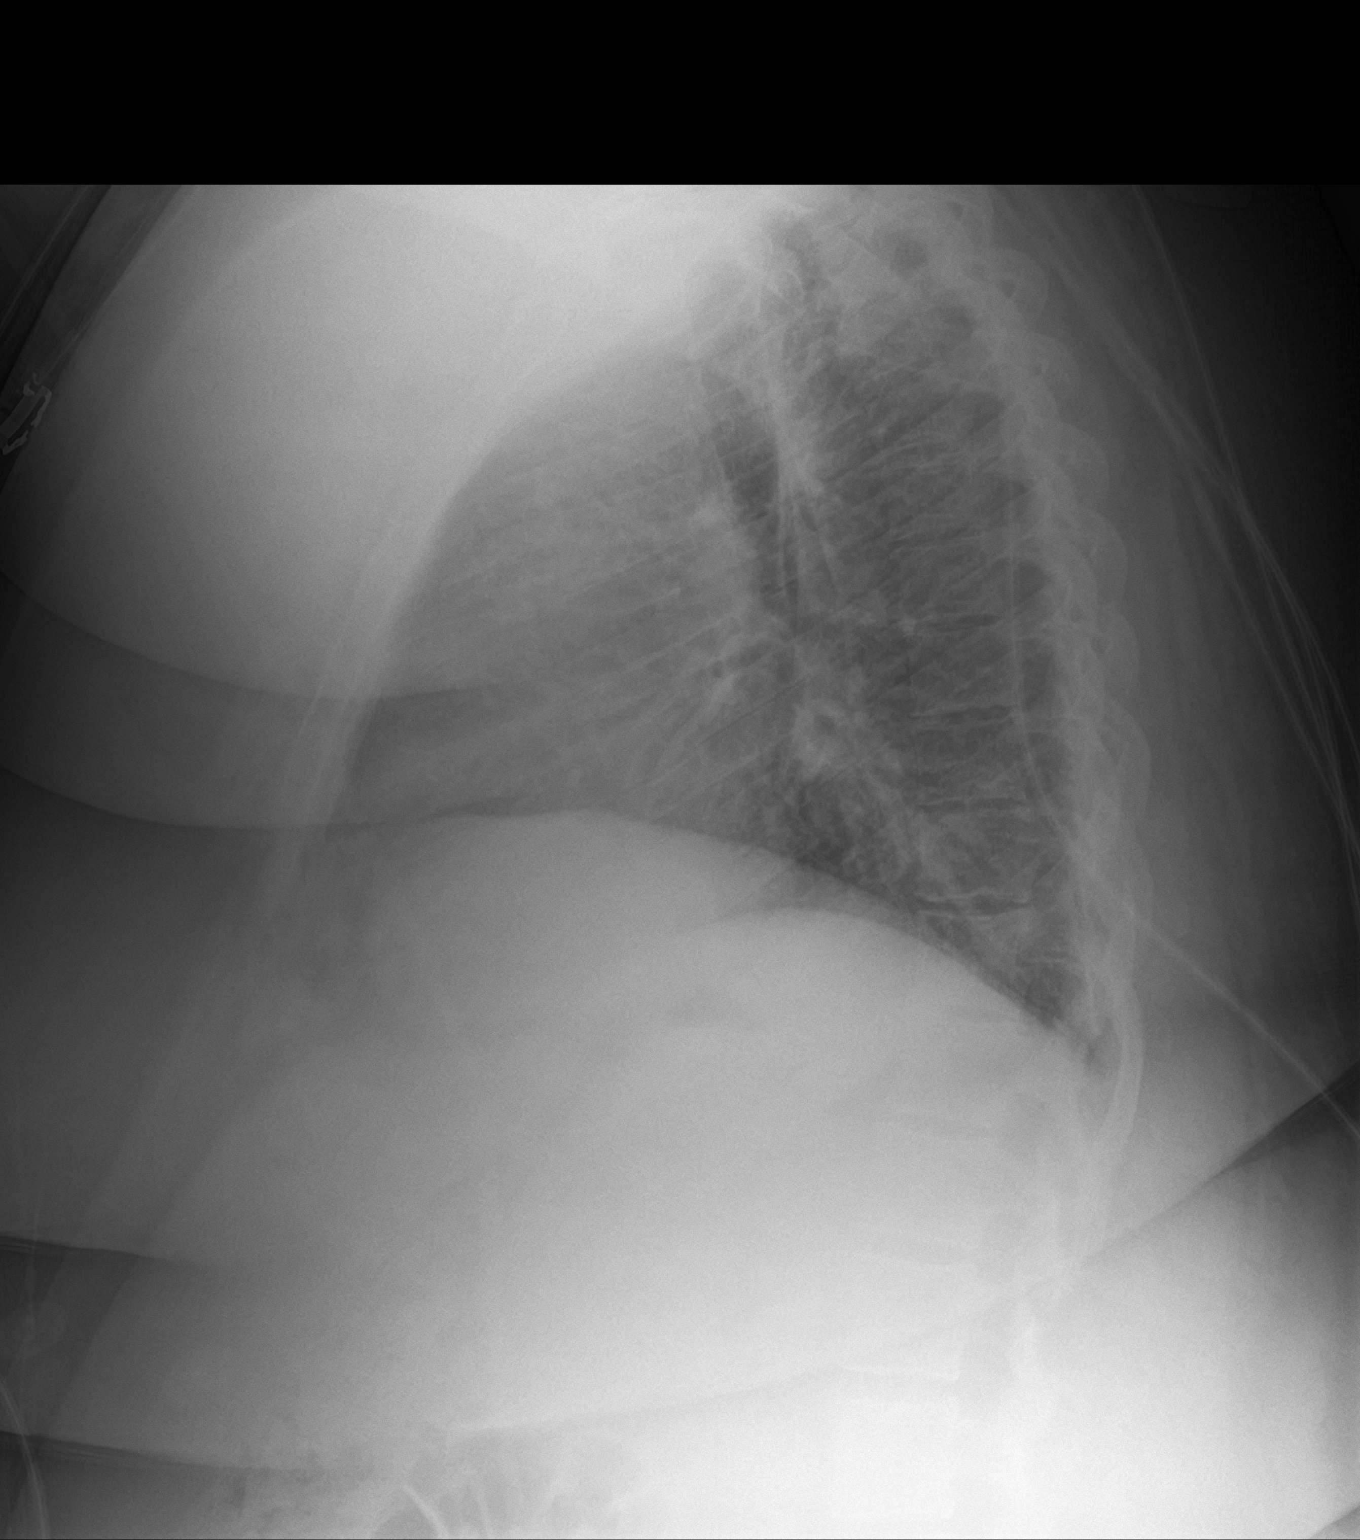

[2 of 2 positions shown; findings below may reference images not displayed]

FINDINGS: The cardiomediastinal contours are normal. The lungs are clear.
Pulmonary vasculature is normal. No consolidation, pleural effusion,
or pneumothorax. No acute osseous abnormalities are seen.
IMPRESSION: No acute pulmonary process.

## 2020-03-21 ENCOUNTER — Other Ambulatory Visit: Payer: Self-pay

## 2020-03-21 ENCOUNTER — Encounter (HOSPITAL_COMMUNITY): Payer: Self-pay | Admitting: Oral Surgery

## 2020-03-21 NOTE — Anesthesia Preprocedure Evaluation (Addendum)
Anesthesia Evaluation  Patient identified by MRN, date of birth, ID band Patient awake    Reviewed: Allergy & Precautions, NPO status , Patient's Chart, lab work & pertinent test results  Airway Mallampati: III  TM Distance: >3 FB Neck ROM: Full  Mouth opening: Limited Mouth Opening  Dental  (+) Dental Advisory Given, Poor Dentition   Pulmonary sleep apnea ,    Pulmonary exam normal breath sounds clear to auscultation       Cardiovascular negative cardio ROS Normal cardiovascular exam Rhythm:Regular Rate:Normal     Neuro/Psych negative neurological ROS     GI/Hepatic Neg liver ROS, GERD  ,  Endo/Other  Morbid obesity  Renal/GU negative Renal ROS     Musculoskeletal negative musculoskeletal ROS (+)   Abdominal (+) + obese,   Peds  Hematology negative hematology ROS (+)   Anesthesia Other Findings   Reproductive/Obstetrics                            Anesthesia Physical Anesthesia Plan  ASA: III  Anesthesia Plan: General   Post-op Pain Management:    Induction: Intravenous  PONV Risk Score and Plan: 3 and Ondansetron, Dexamethasone, Treatment may vary due to age or medical condition and Midazolam  Airway Management Planned: Oral ETT and Video Laryngoscope Planned  Additional Equipment: None  Intra-op Plan:   Post-operative Plan: Extubation in OR  Informed Consent: I have reviewed the patients History and Physical, chart, labs and discussed the procedure including the risks, benefits and alternatives for the proposed anesthesia with the patient or authorized representative who has indicated his/her understanding and acceptance.     Dental advisory given  Plan Discussed with: CRNA  Anesthesia Plan Comments:        Anesthesia Quick Evaluation

## 2020-03-21 NOTE — Progress Notes (Signed)
Pt denies SOB and chest pain. Pt stated that she is under the care of Dr. Otho Perl, Cardiology and Dr. Jacelyn Grip, PCP. Pt denies having a stress test and cardiac cath. Nurse requested EKG tracing from [facility]). Pt made aware to stop taking Aspirin (unless otherwise advised by surgeon), vitamins, fish oil and herbal medications. Do not take any NSAIDs ie: Ibuprofen, Advil, Naproxen (Aleve), Motrin, BC and Goody Powder. Pt stated that she had a sleep study done many years ago and she is scheduled to repeat sleep study at [facility] . Pt reminded to quarantine. Pt verbalized understanding of all pre-op instructions.

## 2020-03-21 NOTE — H&P (Signed)
HISTORY AND PHYSICAL  Paige [name] is a 34 y.o. female patient with CC: Painful teeth  No diagnosis found.  Past Medical History:  Diagnosis Date  . Celiac disease   . Diverticulitis   . Fatty liver   . GERD (gastroesophageal reflux disease)   . Morbid obesity ([place])   . Sleep apnea     No current facility-administered medications for this encounter.   Current Outpatient Medications  Medication Sig Dispense Refill  . FLUoxetine (PROZAC) 20 MG tablet Take 20 mg by mouth 2 (two) times daily.    Marland Kitchen HYDROcodone-acetaminophen (NORCO/VICODIN) 5-325 MG tablet Take 1-2 tablets by mouth every 4 (four) hours as needed. (Patient taking differently: Take 1-2 tablets by mouth every 4 (four) hours as needed (for pain.). ) 8 tablet 0  . metoprolol succinate (TOPROL-XL) 25 MG 24 hr tablet Take 25 mg by mouth daily.    . ondansetron (ZOFRAN ODT) 4 MG disintegrating tablet Take 1 tablet (4 mg total) by mouth every 8 (eight) hours as needed for nausea or vomiting. 20 tablet 0   Allergies  Allergen Reactions  . Gluten Meal Other (See Comments)    Celiac disease  . Latex Itching   Active Problems:   * No active hospital problems. *  Vitals: There were no vitals taken for this visit. Lab results:No results found for this or any previous visit (from the past 3 hour(s)). Radiology Results: No results found. General appearance: alert, cooperative, no distress and morbidly obese Head: Normocephalic, without obvious abnormality, atraumatic Eyes: negative Nose: Nares normal. Septum midline. Mucosa normal. No drainage or sinus tenderness. Throat: Multiple dental caries and periodontal disease all remaining teeth. No purulence, fluctuance, or trismus. Pharynx clear. Neck: no adenopathy and supple, symmetrical, trachea midline  Assessment: All teeth non-restorable secondary to dental caries, periodontitis.  Plan: Full mouth extractions with alveoloplasty.[facility] Day surgery.   Diona Browner 03/21/2020

## 2020-03-22 ENCOUNTER — Encounter (HOSPITAL_COMMUNITY): Payer: Self-pay | Admitting: Oral Surgery

## 2020-03-22 ENCOUNTER — Encounter (HOSPITAL_COMMUNITY)
Admission: RE | Disposition: A | Discharge status: Home / Self Care | Payer: Self-pay | Source: Home / Self Care | Attending: Oral Surgery

## 2020-03-22 ENCOUNTER — Ambulatory Visit (HOSPITAL_COMMUNITY)
Admission: RE | Admit: 2020-03-22 | Discharge: 2020-03-22 | Disposition: A | Discharge status: Home / Self Care | Payer: Medicaid Other | Attending: Oral Surgery | Admitting: Oral Surgery

## 2020-03-22 ENCOUNTER — Ambulatory Visit (HOSPITAL_COMMUNITY): Payer: Medicaid Other | Admitting: Registered Nurse

## 2020-03-22 DIAGNOSIS — K0530 Chronic periodontitis, unspecified: Secondary | ICD-10-CM | POA: Insufficient documentation

## 2020-03-22 DIAGNOSIS — K900 Celiac disease: Secondary | ICD-10-CM | POA: Diagnosis not present

## 2020-03-22 DIAGNOSIS — K029 Dental caries, unspecified: Secondary | ICD-10-CM | POA: Insufficient documentation

## 2020-03-22 DIAGNOSIS — Z91040 Latex allergy status: Secondary | ICD-10-CM | POA: Insufficient documentation

## 2020-03-22 DIAGNOSIS — G4730 Sleep apnea, unspecified: Secondary | ICD-10-CM | POA: Insufficient documentation

## 2020-03-22 DIAGNOSIS — Z20822 Contact with and (suspected) exposure to covid-19: Secondary | ICD-10-CM | POA: Insufficient documentation

## 2020-03-22 DIAGNOSIS — Z6841 Body Mass Index (BMI) 40.0 and over, adult: Secondary | ICD-10-CM | POA: Insufficient documentation

## 2020-03-22 DIAGNOSIS — Z79899 Other long term (current) drug therapy: Secondary | ICD-10-CM | POA: Diagnosis not present

## 2020-03-22 DIAGNOSIS — K760 Fatty (change of) liver, not elsewhere classified: Secondary | ICD-10-CM | POA: Diagnosis not present

## 2020-03-22 DIAGNOSIS — K9181 Other intraoperative complications of digestive system: Secondary | ICD-10-CM | POA: Diagnosis not present

## 2020-03-22 DIAGNOSIS — Z91018 Allergy to other foods: Secondary | ICD-10-CM | POA: Insufficient documentation

## 2020-03-22 HISTORY — DX: Polycystic ovarian syndrome: E28.2

## 2020-03-22 HISTORY — DX: Presence of spectacles and contact lenses: Z97.3

## 2020-03-22 HISTORY — DX: Palpitations: R00.2

## 2020-03-22 HISTORY — PX: TOOTH EXTRACTION: SHX859

## 2020-03-22 HISTORY — DX: Dental caries, unspecified: K02.9

## 2020-03-22 LAB — COMPREHENSIVE METABOLIC PANEL
ALT: 16 U/L (ref 0–44)
AST: 17 U/L (ref 15–41)
Albumin: 3.1 g/dL — ABNORMAL LOW (ref 3.5–5.0)
Alkaline Phosphatase: 67 U/L (ref 38–126)
Anion gap: 10 (ref 5–15)
BUN: 10 mg/dL (ref 6–20)
CO2: 24 mmol/L (ref 22–32)
Calcium: 9 mg/dL (ref 8.9–10.3)
Chloride: 102 mmol/L (ref 98–111)
Creatinine, Ser: 0.69 mg/dL (ref 0.44–1.00)
GFR calc Af Amer: >60 mL/min (ref >60)
GFR calc non Af Amer: >60 mL/min (ref >60)
Glucose, Bld: 106 mg/dL — ABNORMAL HIGH (ref 70–99)
Potassium: 4.4 mmol/L (ref 3.5–5.1)
Sodium: 136 mmol/L (ref 135–145)
Total Bilirubin: 0.4 mg/dL (ref 0.3–1.2)
Total Protein: 7.6 g/dL (ref 6.5–8.1)

## 2020-03-22 LAB — CBC
HCT: 43 % (ref 36.0–46.0)
Hemoglobin: 12.7 g/dL (ref 12.0–15.0)
MCH: 27.7 pg (ref 26.0–34.0)
MCHC: 29.5 g/dL — ABNORMAL LOW (ref 30.0–36.0)
MCV: 93.9 fL (ref 80.0–100.0)
Platelets: 298 10*3/uL (ref 150–400)
RBC: 4.58 MIL/uL (ref 3.87–5.11)
RDW: 14.7 % (ref 11.5–15.5)
WBC: 12.1 10*3/uL — ABNORMAL HIGH (ref 4.0–10.5)
nRBC: 0 % (ref 0.0–0.2)

## 2020-03-22 LAB — SARS CORONAVIRUS 2 BY RT PCR (HOSPITAL ORDER, PERFORMED IN ~~LOC~~ HOSPITAL LAB): SARS Coronavirus 2: NEGATIVE

## 2020-03-22 LAB — POCT PREGNANCY, URINE: Preg Test, Ur: NEGATIVE

## 2020-03-22 SURGERY — DENTAL RESTORATION/EXTRACTIONS
Anesthesia: General | Site: Mouth

## 2020-03-22 MED ORDER — ORAL CARE MOUTH RINSE: 15.0000 mL | Freq: Once | OROMUCOSAL | Status: AC

## 2020-03-22 MED ORDER — AMOXICILLIN 500 MG PO CAPS
500.0000 mg | ORAL_CAPSULE | Freq: Three times a day (TID) | ORAL | 0 refills | Status: DC
Start: 2020-03-22 — End: 2024-03-01

## 2020-03-22 MED ORDER — OXYCODONE-ACETAMINOPHEN 5-325 MG PO TABS: 1.0000 | ORAL_TABLET | ORAL | 0 refills | Status: DC | PRN

## 2020-03-22 MED ORDER — DEXAMETHASONE SODIUM PHOSPHATE 10 MG/ML IJ SOLN
INTRAMUSCULAR | Status: AC
  Filled 2020-03-22: qty 1

## 2020-03-22 MED ORDER — SUCCINYLCHOLINE CHLORIDE 200 MG/10ML IV SOSY
PREFILLED_SYRINGE | INTRAVENOUS | Status: AC
  Filled 2020-03-22: qty 10

## 2020-03-22 MED ORDER — ROCURONIUM BROMIDE 10 MG/ML (PF) SYRINGE
PREFILLED_SYRINGE | INTRAVENOUS | Status: DC | PRN
Start: 2020-03-22 — End: 2020-03-22
  Administered 2020-03-22: 50 mg via INTRAVENOUS

## 2020-03-22 MED ORDER — FENTANYL CITRATE (PF) 250 MCG/5ML IJ SOLN
INTRAMUSCULAR | Status: AC
  Filled 2020-03-22: qty 5

## 2020-03-22 MED ORDER — PROPOFOL 10 MG/ML IV BOLUS
INTRAVENOUS | Status: AC
  Filled 2020-03-22: qty 60

## 2020-03-22 MED ORDER — ROCURONIUM BROMIDE 10 MG/ML (PF) SYRINGE
PREFILLED_SYRINGE | INTRAVENOUS | Status: AC
  Filled 2020-03-22: qty 10

## 2020-03-22 MED ORDER — HYDROMORPHONE HCL 1 MG/ML IJ SOLN
INTRAMUSCULAR | Status: AC
  Filled 2020-03-22: qty 1

## 2020-03-22 MED ORDER — PHENYLEPHRINE 40 MCG/ML (10ML) SYRINGE FOR IV PUSH (FOR BLOOD PRESSURE SUPPORT)
PREFILLED_SYRINGE | INTRAVENOUS | Status: DC | PRN
  Administered 2020-03-22: 80 ug via INTRAVENOUS

## 2020-03-22 MED ORDER — MIDAZOLAM HCL 5 MG/5ML IJ SOLN
INTRAMUSCULAR | Status: DC | PRN
  Administered 2020-03-22: 2 mg via INTRAVENOUS

## 2020-03-22 MED ORDER — LIDOCAINE 2% (20 MG/ML) 5 ML SYRINGE
INTRAMUSCULAR | Status: AC
  Filled 2020-03-22: qty 5

## 2020-03-22 MED ORDER — DEXTROSE 5 % IV SOLN
3.0000 g | INTRAVENOUS | Status: AC
  Administered 2020-03-22: 3 g via INTRAVENOUS
  Filled 2020-03-22 (×2): qty 3000

## 2020-03-22 MED ORDER — METOPROLOL TARTRATE 5 MG/5ML IV SOLN
INTRAVENOUS | Status: DC | PRN
  Administered 2020-03-22: 1 mg via INTRAVENOUS

## 2020-03-22 MED ORDER — 0.9 % SODIUM CHLORIDE (POUR BTL) OPTIME
TOPICAL | Status: DC | PRN
  Administered 2020-03-22: 1000 mL

## 2020-03-22 MED ORDER — OXYMETAZOLINE HCL 0.05 % NA SOLN
NASAL | Status: AC
  Filled 2020-03-22: qty 30

## 2020-03-22 MED ORDER — CHLORHEXIDINE GLUCONATE 0.12 % MT SOLN
15.0000 mL | Freq: Once | OROMUCOSAL | Status: AC
  Administered 2020-03-22: 15 mL via OROMUCOSAL

## 2020-03-22 MED ORDER — DEXAMETHASONE SODIUM PHOSPHATE 4 MG/ML IJ SOLN
INTRAMUSCULAR | Status: DC | PRN
  Administered 2020-03-22: 10 mg via INTRAVENOUS

## 2020-03-22 MED ORDER — METOPROLOL TARTRATE 5 MG/5ML IV SOLN
INTRAVENOUS | Status: AC
  Filled 2020-03-22: qty 5

## 2020-03-22 MED ORDER — CLARITIN-D 24 HOUR 10-240 MG PO TB24
1.0000 | ORAL_TABLET | Freq: Every day | ORAL | 0 refills | Status: DC
Start: 2020-03-22 — End: 2024-03-01

## 2020-03-22 MED ORDER — LIDOCAINE-EPINEPHRINE 2 %-1:100000 IJ SOLN
INTRAMUSCULAR | Status: AC
  Filled 2020-03-22: qty 10.2

## 2020-03-22 MED ORDER — ONDANSETRON HCL 4 MG/2ML IJ SOLN
INTRAMUSCULAR | Status: AC
  Filled 2020-03-22: qty 2

## 2020-03-22 MED ORDER — PROPOFOL 10 MG/ML IV BOLUS
INTRAVENOUS | Status: DC | PRN
  Administered 2020-03-22: 200 mg via INTRAVENOUS

## 2020-03-22 MED ORDER — MEPERIDINE HCL 25 MG/ML IJ SOLN: 6.2500 mg | INTRAMUSCULAR | Status: DC | PRN

## 2020-03-22 MED ORDER — LIDOCAINE 2% (20 MG/ML) 5 ML SYRINGE
INTRAMUSCULAR | Status: DC | PRN
  Administered 2020-03-22: 100 mg via INTRAVENOUS

## 2020-03-22 MED ORDER — MIDAZOLAM HCL 2 MG/2ML IJ SOLN
INTRAMUSCULAR | Status: AC
  Filled 2020-03-22: qty 2

## 2020-03-22 MED ORDER — SUGAMMADEX SODIUM 500 MG/5ML IV SOLN
INTRAVENOUS | Status: AC
  Filled 2020-03-22: qty 5

## 2020-03-22 MED ORDER — TALC (STERITALC) POWDER FOR INTRAPLEURAL USE
INTRAPLEURAL | Status: AC
  Filled 2020-03-22: qty 4

## 2020-03-22 MED ORDER — GLYCOPYRROLATE 0.2 MG/ML IJ SOLN
INTRAMUSCULAR | Status: DC | PRN
Start: 2020-03-22 — End: 2020-03-22
  Administered 2020-03-22: .2 mg via INTRAVENOUS

## 2020-03-22 MED ORDER — SUCCINYLCHOLINE CHLORIDE 20 MG/ML IJ SOLN
INTRAMUSCULAR | Status: DC | PRN
  Administered 2020-03-22: 160 mg via INTRAVENOUS

## 2020-03-22 MED ORDER — PROMETHAZINE HCL 25 MG/ML IJ SOLN: 6.2500 mg | INTRAMUSCULAR | Status: DC | PRN

## 2020-03-22 MED ORDER — HYDROMORPHONE HCL 1 MG/ML IJ SOLN
0.2500 mg | INTRAMUSCULAR | Status: DC | PRN
  Administered 2020-03-22 (×2): 0.5 mg via INTRAVENOUS

## 2020-03-22 MED ORDER — FENTANYL CITRATE (PF) 100 MCG/2ML IJ SOLN
INTRAMUSCULAR | Status: DC | PRN
  Administered 2020-03-22 (×2): 50 ug via INTRAVENOUS
  Administered 2020-03-22: 25 ug via INTRAVENOUS

## 2020-03-22 MED ORDER — SODIUM CHLORIDE 0.9 % IR SOLN
Status: DC | PRN
  Administered 2020-03-22: 1000 mL

## 2020-03-22 MED ORDER — LIDOCAINE-EPINEPHRINE 2 %-1:100000 IJ SOLN
INTRAMUSCULAR | Status: AC
  Filled 2020-03-22: qty 1

## 2020-03-22 MED ORDER — LACTATED RINGERS IV SOLN: INTRAVENOUS | Status: DC

## 2020-03-22 MED ORDER — LIDOCAINE-EPINEPHRINE 2 %-1:100000 IJ SOLN
INTRAMUSCULAR | Status: DC | PRN
  Administered 2020-03-22: 10 mL via INTRADERMAL

## 2020-03-22 MED ORDER — SUGAMMADEX SODIUM 500 MG/5ML IV SOLN
INTRAVENOUS | Status: DC | PRN
  Administered 2020-03-22: 400 mg via INTRAVENOUS

## 2020-03-22 MED ORDER — ONDANSETRON HCL 4 MG/2ML IJ SOLN
INTRAMUSCULAR | Status: DC | PRN
  Administered 2020-03-22: 4 mg via INTRAVENOUS

## 2020-03-22 SURGICAL SUPPLY — 44 items
BLADE SURG 15 STRL LF DISP TIS (BLADE) ×1 IMPLANT
BLADE SURG 15 STRL SS (BLADE) ×2
BUR CROSS CUT FISSURE 1.6 (BURR) ×2 IMPLANT
BUR CROSS CUT FISSURE 1.6MM (BURR) ×1
BUR EGG ELITE 4.0 (BURR) ×2 IMPLANT
BUR EGG ELITE 4.0MM (BURR) ×1
CANISTER SUCT 3000ML PPV (MISCELLANEOUS) ×3 IMPLANT
COVER SURGICAL LIGHT HANDLE (MISCELLANEOUS) ×3 IMPLANT
DECANTER SPIKE VIAL GLASS SM (MISCELLANEOUS) ×1 IMPLANT
DRAPE U-SHAPE 76X120 STRL (DRAPES) ×3 IMPLANT
GAUZE PACKING FOLDED 2  STR (GAUZE/BANDAGES/DRESSINGS) ×2
GAUZE PACKING FOLDED 2 STR (GAUZE/BANDAGES/DRESSINGS) ×1 IMPLANT
GLOVE BIO SURGEON STRL SZ 6.5 (GLOVE) IMPLANT
GLOVE BIO SURGEON STRL SZ7 (GLOVE) IMPLANT
GLOVE BIO SURGEON STRL SZ8 (GLOVE) ×1 IMPLANT
GLOVE BIO SURGEONS STRL SZ 6.5 (GLOVE)
GLOVE BIOGEL PI IND STRL 6.5 (GLOVE) IMPLANT
GLOVE BIOGEL PI IND STRL 7.0 (GLOVE) IMPLANT
GLOVE BIOGEL PI IND STRL 7.5 (GLOVE) IMPLANT
GLOVE BIOGEL PI IND STRL 8 (GLOVE) IMPLANT
GLOVE BIOGEL PI INDICATOR 6.5 (GLOVE) ×2
GLOVE BIOGEL PI INDICATOR 7.0 (GLOVE) ×2
GLOVE BIOGEL PI INDICATOR 7.5 (GLOVE) ×2
GLOVE BIOGEL PI INDICATOR 8 (GLOVE) ×2
GOWN STRL REUS W/ TWL LRG LVL3 (GOWN DISPOSABLE) ×1 IMPLANT
GOWN STRL REUS W/ TWL XL LVL3 (GOWN DISPOSABLE) ×1 IMPLANT
GOWN STRL REUS W/TWL LRG LVL3 (GOWN DISPOSABLE) ×2
GOWN STRL REUS W/TWL XL LVL3 (GOWN DISPOSABLE) ×2
IV NS 1000ML (IV SOLUTION) ×2
IV NS 1000ML BAXH (IV SOLUTION) ×1 IMPLANT
KIT BASIN OR (CUSTOM PROCEDURE TRAY) ×3 IMPLANT
KIT TURNOVER KIT B (KITS) ×3 IMPLANT
NDL HYPO 25GX1X1/2 BEV (NEEDLE) ×2 IMPLANT
NEEDLE HYPO 25GX1X1/2 BEV (NEEDLE) ×6 IMPLANT
NS IRRIG 1000ML POUR BTL (IV SOLUTION) ×3 IMPLANT
PAD ARMBOARD 7.5X6 YLW CONV (MISCELLANEOUS) ×7 IMPLANT
SLEEVE IRRIGATION ELITE 7 (MISCELLANEOUS) ×3 IMPLANT
SPONGE SURGIFOAM ABS GEL 12-7 (HEMOSTASIS) IMPLANT
SUT CHROMIC 3 0 PS 2 (SUTURE) ×7 IMPLANT
SUT VIC AB 3-0 PS2 18 (SUTURE) ×2 IMPLANT
SYR CONTROL 10ML LL (SYRINGE) ×3 IMPLANT
TRAY ENT MC OR (CUSTOM PROCEDURE TRAY) ×3 IMPLANT
TUBING IRRIGATION (MISCELLANEOUS) ×3 IMPLANT
YANKAUER SUCT BULB TIP NO VENT (SUCTIONS) ×3 IMPLANT

## 2020-03-22 NOTE — Op Note (Signed)
03/22/2020  9:16 AM  PATIENT:  Paige [name]  34 y.o. female  PRE-OPERATIVE DIAGNOSIS:  NON RESTORABLE TEETH  SECONDARY TO DENTAL CARIES AND PERIODONTITIS # 3, 4, 5, 6, 7, 8, 9, 10, 11, 12, 13, 20, 21, 22, 23, 24, 25, 26, 27, 29  POST-OPERATIVE DIAGNOSIS:  SAME  PROCEDURE:  Procedure(s): EXTRACTION TEETH # 3, 4, 5, 6, 7, 8, 9, 10, 11, 12, 13, 20, 21, 22, 23, 24, 25, 26, 27, 29; ALVEOLOPLASTY RIGHT AND LEFT MAXILLA AND MANDIBLE   SURGEON:  Surgeon(s): Diona Browner, DDS  ANESTHESIA:   local and general  EBL:  minimal  DRAINS: none   SPECIMEN:  No Specimen  COUNTS:  YES  PLAN OF CARE: Discharge to home after PACU  PATIENT DISPOSITION:  PACU - hemodynamically stable.   PROCEDURE DETAILS: Dictation # [id number]  Gae Bon, DMD 03/22/2020 9:16 AM

## 2020-03-22 NOTE — Anesthesia Procedure Notes (Signed)
Procedure Name: Intubation Date/Time: 03/22/2020 7:59 AM Performed by: Eulas Post, [place] Zapien W, CRNA Pre-anesthesia Checklist: Patient identified, Emergency Drugs available, Suction available and Patient being monitored Patient Re-evaluated:Patient Re-evaluated prior to induction Oxygen Delivery Method: Circle system utilized Preoxygenation: Pre-oxygenation with 100% oxygen Induction Type: IV induction Ventilation: Mask ventilation without difficulty Laryngoscope Size: Glidescope Grade View: Grade I Tube type: Oral Tube size: 7.0 mm Number of attempts: 1 Airway Equipment and Method: Stylet Placement Confirmation: ETT inserted through vocal cords under direct vision,  positive ETCO2 and breath sounds checked- equal and bilateral Secured at: 23 cm Tube secured with: Tape Dental Injury: Teeth and Oropharynx as per pre-operative assessment

## 2020-03-22 NOTE — Op Note (Signed)
NAMEMARLAYNA, [name] MEDICAL RECORD [id number] ACCOUNT [id number] DATE OF BIRTH:[birth year] FACILITY: [facility] LOCATION: [facility] PHYSICIAN:Paige [name], DDS  OPERATIVE REPORT  DATE OF PROCEDURE:  03/22/2020  PREOPERATIVE DIAGNOSES:   1.  Nonrestorable teeth secondary to dental caries and periodontitis numbers 3, 4, 5, 6, 7, 8, 9, 10, 11, 12, 13, 20, 21, 22, 23, 24, 25, 26, 27, 29. 2.   Morbid obesity.  POSTOPERATIVE DIAGNOSES: 1.  Nonrestorable teeth secondary to dental caries and periodontitis numbers 3, 4, 5, 6, 7, 8, 9, 10, 11, 12, 13, 20, 21, 22, 23, 24, 25, 26, 27, 29. 2.   Morbid obesity. 3. Oral antral communication  Right maxilla  PROCEDURE:   1.  Extraction of teeth numbers 3, 4, 5, 6, 7, 8, 9, 10, 11, 12, 13, 20, 21, 22, 23, 24, 25, 26, 27, 29.   2.  Alveoplasty right and left maxilla and mandible. 3. Closure right oral antral communication with bone graft, buccal fat pad pedicle graft.  SURGEON:  Paige [name], DDS  ANESTHESIA:  General oral intubation, Dr. Lissa [name] attending.  DESCRIPTION OF PROCEDURE:  The patient was taken to the operating room and placed on the table in supine position.  General anesthesia was administered and an oral endotracheal tube was placed and secured.  The eyes were protected and the patient was  draped for surgery.  A timeout was performed.  The posterior pharynx was suctioned and a throat pack was placed.  Two percent lidocaine with 1:100,000 epinephrine was infiltrated in an inferior alveolar block on the right and left sides and in buccal and  palatal infiltration in the maxilla.  A bite block was placed on the right side of the mouth and a sweetheart retractor was used to retract the tongue.  Visualization was difficult because of the patient's morbid obesity and body habitus.  A 15 blade  was used to make an incision buccally and lingually in the gingival sulcus, beginning at tooth #20 and carried forward to tooth #26.  The periosteum was  reflected from around these teeth and then the teeth were elevated with a 301 elevator and removed  from the mouth with the dental forceps.  The sockets were then curetted.  The periosteum was reflected from the alveolar crest and then using a Stryker handpiece with an egg-shaped bur,  alveoplasty was performed.  The bone fracture around some of the  front teeth and was no longer adherent to the bone, so this bone was removed and these sharp edges remaining were smoothed with the egg bur and then the bone file was used to further smooth these areas and left mandible was closed with 3-0 chromic.  In  the left maxilla, a 15 blade used to make an incision beginning at tooth #13 and carried forward across the midline to tooth #6.  Then the periosteum was reflected from around these teeth.  The teeth were elevated and removed from the mouth with the  dental forceps.  The sockets were curetted.  The periosteum was reflected to expose the alveolar crest and then alveoplasty was performed using an egg bur, followed by the bone file.  Then, the areas were irrigated and closed with 3-0 chromic.  Then, the  endotracheal tube was repositioned to the left side of the mouth.  A throat pack was taken out and replaced with a new throat pack and after the tube was secured, a bite block was placed in the mouth and a sweetheart retractor was used to  retract the  tongue.  A 15 blade was used to make an incision around teeth numbers 27 and 29 and around teeth numbers 3, 4, 5 and 6 in the maxilla.  The periosteum was reflected.  The teeth were elevated and removed with forceps.  In the maxilla, bone was adherent to  teeth numbers 3 and 4 and as the teeth were removed, bone was luxated with the teeth and a 1 cm sinus opening was noted.  The periosteum was reflected to expose the alveolar crest in the right maxilla and mandible in the mandible.  The alveoplasty was  performed using an egg bur, followed by the bone file in the  maxilla.  Alveoplasty was performed again using an egg bur and bone file and then the distal aspect of the incision was taken approximately 1 cm back with a 15 blade.  The periosteum was  released and incised in the depth of the sulcus to expose the buccal fat.  The buccal fat was gently elevated with a hemostat and Cushing forceps until it could extend over the sinus opening.  The bone that had been removed with the previously extracted  teeth was cut with the rongeurs to make small pieces and these were packed into the sinus opening and then the pedicle buccal fat pad was sutured to the palatal gingiva with 3-0 Vicryl.  Then, the maxillary incision was closed with 3-0 Vicryl, primary  closure having been achieved with a periosteal release and incision extension.  The right mandible was closed with a 3-0 chromic.  The oral cavity was then irrigated and suctioned.  Throat pack was removed.  The patient was left in care of anesthesia for  extubation and transport to recovery room with plans for discharge home through day surgery.  ESTIMATED BLOOD LOSS:  Minimal.  COMPLICATIONS:  None.  SPECIMENS:  None.  VN/NUANCE  D:03/22/2020 T:03/22/2020 JOB:[id number]

## 2020-03-22 NOTE — Transfer of Care (Signed)
Immediate Anesthesia Transfer of Care Note  Patient: Paige [name]  Procedure(s) Performed: MULTIPLE /EXTRACTIONS (N/A Mouth)  Patient Location: PACU  Anesthesia Type:General  Level of Consciousness: awake  Airway & Oxygen Therapy: Patient Spontanous Breathing and Patient connected to face mask oxygen  Post-op Assessment: Report given to RN and Post -op Vital signs reviewed and stable  Post vital signs: Reviewed and stable  Last Vitals:  Vitals Value Taken Time  BP    Temp    Pulse    Resp    SpO2      Last Pain:  Vitals:   03/22/20 0704  PainSc: 0-No pain         Complications: No complications documented.

## 2020-03-22 NOTE — H&P (Signed)
H&P documentation  -History and Physical Reviewed  -Patient has been re-examined  -No change in the plan of care  Paige [name]  

## 2020-03-25 ENCOUNTER — Encounter (HOSPITAL_COMMUNITY): Payer: Self-pay | Admitting: Oral Surgery

## 2020-03-25 NOTE — Anesthesia Postprocedure Evaluation (Signed)
Anesthesia Post Note  Patient: Paige [name]  Procedure(s) Performed: MULTIPLE /EXTRACTIONS (N/A Mouth)     Patient location during evaluation: PACU Anesthesia Type: General Level of consciousness: sedated and patient cooperative Pain management: pain level controlled Vital Signs Assessment: post-procedure vital signs reviewed and stable Respiratory status: spontaneous breathing Cardiovascular status: stable Anesthetic complications: no   No complications documented.  Last Vitals:  Vitals:   03/22/20 1015 03/22/20 1028  BP: 127/70 135/69  Pulse: 96 96  Resp: 20 20  Temp:  36.7 C  SpO2: 94% 92%    Last Pain:  Vitals:   03/22/20 1015  PainSc: [place]

## 2020-08-11 ENCOUNTER — Encounter (HOSPITAL_BASED_OUTPATIENT_CLINIC_OR_DEPARTMENT_OTHER): Payer: Self-pay | Admitting: Emergency Medicine

## 2020-08-11 ENCOUNTER — Other Ambulatory Visit: Payer: Self-pay

## 2020-08-11 ENCOUNTER — Emergency Department (HOSPITAL_BASED_OUTPATIENT_CLINIC_OR_DEPARTMENT_OTHER)
Admission: EM | Admit: 2020-08-11 | Discharge: 2020-08-11 | Disposition: A | Discharge status: Home / Self Care | Payer: Medicaid Other | Attending: Emergency Medicine | Admitting: Emergency Medicine

## 2020-08-11 DIAGNOSIS — Z5321 Procedure and treatment not carried out due to patient leaving prior to being seen by health care provider: Secondary | ICD-10-CM | POA: Diagnosis not present

## 2020-08-11 DIAGNOSIS — W19XXXA Unspecified fall, initial encounter: Secondary | ICD-10-CM | POA: Insufficient documentation

## 2020-08-11 DIAGNOSIS — R109 Unspecified abdominal pain: Secondary | ICD-10-CM | POA: Diagnosis not present

## 2020-08-11 NOTE — ED Notes (Signed)
Called patient back x4 and walked all through lobby. No response. CHarge RN aware.

## 2020-08-11 NOTE — ED Triage Notes (Signed)
Pt arrives pov with c/o fall on Friday. Pt endorses abdominal pain, concern r/yt prior hernia surgery. Pt denies loc, denies hitting head. Reports diarrhea since fall. Pt ambulatory

## 2020-08-15 ENCOUNTER — Other Ambulatory Visit: Payer: Self-pay

## 2020-08-15 ENCOUNTER — Emergency Department (HOSPITAL_BASED_OUTPATIENT_CLINIC_OR_DEPARTMENT_OTHER)
Admission: EM | Admit: 2020-08-15 | Discharge: 2020-08-15 | Disposition: A | Discharge status: Home / Self Care | Payer: Medicaid Other | Attending: Emergency Medicine | Admitting: Emergency Medicine

## 2020-08-15 ENCOUNTER — Encounter (HOSPITAL_BASED_OUTPATIENT_CLINIC_OR_DEPARTMENT_OTHER): Payer: Self-pay | Admitting: *Deleted

## 2020-08-15 DIAGNOSIS — W19XXXA Unspecified fall, initial encounter: Secondary | ICD-10-CM | POA: Insufficient documentation

## 2020-08-15 DIAGNOSIS — R109 Unspecified abdominal pain: Secondary | ICD-10-CM | POA: Diagnosis not present

## 2020-08-15 DIAGNOSIS — Z5321 Procedure and treatment not carried out due to patient leaving prior to being seen by health care provider: Secondary | ICD-10-CM | POA: Diagnosis not present

## 2020-08-15 NOTE — ED Triage Notes (Addendum)
C/o fall x 8 days ago , c/o abd pain . No bruising or  discoloration to abd , denies hematuria

## 2024-03-01 ENCOUNTER — Ambulatory Visit (HOSPITAL_BASED_OUTPATIENT_CLINIC_OR_DEPARTMENT_OTHER): Admission: EM | Admit: 2024-03-01 | Discharge: 2024-03-01 | Disposition: A | Discharge status: Home / Self Care

## 2024-03-01 ENCOUNTER — Encounter (HOSPITAL_BASED_OUTPATIENT_CLINIC_OR_DEPARTMENT_OTHER): Payer: Self-pay

## 2024-03-01 ENCOUNTER — Other Ambulatory Visit (HOSPITAL_BASED_OUTPATIENT_CLINIC_OR_DEPARTMENT_OTHER): Payer: Self-pay

## 2024-03-01 ENCOUNTER — Ambulatory Visit (INDEPENDENT_AMBULATORY_CARE_PROVIDER_SITE_OTHER): Admitting: Radiology

## 2024-03-01 ENCOUNTER — Ambulatory Visit (INDEPENDENT_AMBULATORY_CARE_PROVIDER_SITE_OTHER)
Admit: 2024-03-01 | Discharge: 2024-03-01 | Disposition: A | Discharge status: Home / Self Care | Attending: Family Medicine | Admitting: Family Medicine

## 2024-03-01 DIAGNOSIS — M25561 Pain in right knee: Secondary | ICD-10-CM

## 2024-03-01 DIAGNOSIS — M79661 Pain in right lower leg: Secondary | ICD-10-CM

## 2024-03-01 DIAGNOSIS — M1711 Unilateral primary osteoarthritis, right knee: Secondary | ICD-10-CM | POA: Diagnosis not present

## 2024-03-01 DIAGNOSIS — R2241 Localized swelling, mass and lump, right lower limb: Secondary | ICD-10-CM | POA: Diagnosis not present

## 2024-03-01 MED ORDER — MELOXICAM 15 MG PO TABS
15.0000 mg | ORAL_TABLET | Freq: Every day | ORAL | 0 refills | Status: AC | PRN
  Filled 2024-03-01: qty 30, 30d supply, fill #0

## 2024-03-01 NOTE — Discharge Instructions (Signed)
 Knee pain and right leg pain: Ultrasound was negative for any blood clots.  Knee x-ray showed osteoarthritis.  Meloxicam  15 mg 1 pill daily for arthritis.  Patient has an ibuprofen allergy but reports she is taking meloxicam  without difficulty.  Encouraged rest, ice, compression, elevation.  Follow-up as needed.  May need to see orthopedics.

## 2024-03-01 NOTE — ED Provider Notes (Signed)
 [facility]    CSN: [id number] Arrival date & time: 03/01/24  1431      History   Chief Complaint Chief Complaint  Patient presents with   Leg Pain    HPI Paige [name] is a 38 y.o. female.   38 year old female who is morbidly obese and has pigment changes in the lower legs of chronic stasis dermatitis.  She has acute onset right lower leg pain that started approximately on 02/17/24.  She is not aware of any injury to her leg.  She has pain at the right knee but also in the popliteal fossa.  The pain radiates to her lower leg.  She is limping having significant pain when she puts weight on her right leg.  Her right leg feels heavier than the left.  She has taken Tylenol , Advil, BC powders without relief.  Pain is worse when she is walking or sitting in a chair with her leg bent.   Leg Pain Associated symptoms: no back pain and no fever     Past Medical History:  Diagnosis Date   Celiac disease    Dental caries    periodontitis   Diverticulitis    Fatty liver    GERD (gastroesophageal reflux disease)    Morbid obesity (HCC)    Palpitations    PCOS (polycystic ovarian syndrome)    Sleep apnea    Wears glasses     Patient Active Problem List   Diagnosis Date Noted   Non-intractable cyclical vomiting with nausea    Upper abdominal pain     Past Surgical History:  Procedure Laterality Date   CESAREAN SECTION     CHOLECYSTECTOMY     DILATION AND CURETTAGE OF UTERUS     ESOPHAGOGASTRODUODENOSCOPY N/A 09/28/2017   Procedure: ESOPHAGOGASTRODUODENOSCOPY (EGD);  Surgeon: Legrand Victory LITTIE DOUGLAS, MD;  Location: [facility];  Service: Gastroenterology;  Laterality: N/A;   HERNIA REPAIR     KIDNEY SURGERY     TOOTH EXTRACTION N/A 03/22/2020   Procedure: MULTIPLE /EXTRACTIONS;  Surgeon: Sheryle Hamilton, DDS;  Location: [facility] OR;  Service: Oral Surgery;  Laterality: N/A;    OB History   No obstetric history on file.      Home Medications    Prior to Admission  medications   Medication Sig Start Date End Date Taking? Authorizing Provider  albuterol (VENTOLIN HFA) 108 (90 Base) MCG/ACT inhaler Inhale 2 puffs into the lungs every 4 (four) hours as needed. 09/10/21 11/04/24 Yes [provider]  cyanocobalamin (VITAMIN B12) 1000 MCG/ML injection Inject 1,000 mcg into the muscle every 30 (thirty) days. 08/19/21  Yes [provider]  meloxicam  (MOBIC ) 15 MG tablet Take 1 tablet (15 mg total) by mouth daily as needed for pain (knee pain). 03/01/24  Yes Ival Domino, FNP  omeprazole (PRILOSEC) 40 MG capsule Take 40 mg by mouth as needed. 08/26/23 08/25/24 Yes [provider]  ergocalciferol (VITAMIN D2) 1.25 MG (50000 UT) capsule Take 50,000 Units by mouth once a week. 12/26/19   [provider]  WEGOVY 1 MG/0.5ML SOAJ Inject 1 mg into the skin once a week.    [provider]    Family History Family History  Problem Relation Age of Onset   Diabetes Mother    Prostate cancer Father    Diabetes Sister     Social History Social History   Tobacco Use   Smoking status: Never   Smokeless tobacco: Never  Vaping Use   Vaping status: Never Used  Substance Use Topics   Alcohol use: No   Drug use: No     Allergies   Gluten meal, Ibuprofen, Latex, and Hydrocodone    Review of Systems Review of Systems  Constitutional:  Negative for chills and fever.  HENT:  Negative for ear pain and sore throat.   Eyes:  Negative for pain and visual disturbance.  Respiratory:  Negative for cough and shortness of breath.   Cardiovascular:  Positive for leg swelling (Right lower leg.). Negative for chest pain and palpitations.  Gastrointestinal:  Negative for abdominal pain, constipation, diarrhea, nausea and vomiting.  Genitourinary:  Negative for dysuria and hematuria.  Musculoskeletal:  Positive for gait problem (Due to right knee pain and right leg pain.) and joint swelling (Right knee pain and swelling of right knee and right  lower leg.). Negative for arthralgias and back pain.  Skin:  Negative for color change and rash.  Neurological:  Negative for seizures and syncope.  All other systems reviewed and are negative.    Physical Exam Triage Vital Signs ED Triage Vitals  Encounter Vitals Group     BP 03/01/24 1500 130/83     Girls Systolic BP Percentile --      Girls Diastolic BP Percentile --      Boys Systolic BP Percentile --      Boys Diastolic BP Percentile --      Pulse Rate 03/01/24 1500 88     Resp 03/01/24 1500 20     Temp 03/01/24 1500 98.4 F (36.9 C)     Temp Source 03/01/24 1500 Oral     SpO2 03/01/24 1500 95 %     Weight --      Height --      Head Circumference --      Peak Flow --      Pain Score 03/01/24 1453 8     Pain Loc --      Pain Education --      Exclude from Growth Chart --    No data found.  Updated Vital Signs BP 130/83 (BP Location: Right Wrist)   Pulse 88   Temp 98.4 F (36.9 C) (Oral)   Resp 20   SpO2 95%   Visual Acuity Right Eye Distance:   Left Eye Distance:   Bilateral Distance:    Right Eye Near:   Left Eye Near:    Bilateral Near:     Physical Exam Vitals and nursing note reviewed.  Constitutional:      General: She is not in acute distress.    Appearance: She is well-developed. She is not ill-appearing or toxic-appearing.  HENT:     Head: Normocephalic and atraumatic.     Right Ear: Hearing, tympanic membrane, ear canal and external ear normal.     Left Ear: Hearing, tympanic membrane, ear canal and external ear normal.     Nose: No congestion or rhinorrhea.     Right Sinus: No maxillary sinus tenderness or frontal sinus tenderness.     Left Sinus: No maxillary sinus tenderness or frontal sinus tenderness.     Mouth/Throat:     Lips: Pink.     Mouth: Mucous membranes are moist.     Pharynx: Uvula midline. No oropharyngeal exudate or posterior oropharyngeal erythema.     Tonsils: No tonsillar exudate.  Eyes:     Conjunctiva/sclera:  Conjunctivae normal.     Pupils: Pupils are equal, round, and reactive to light.  Cardiovascular:     Rate  and Rhythm: Normal rate and regular rhythm.     Heart sounds: S1 normal and S2 normal. No murmur heard. Pulmonary:     Effort: Pulmonary effort is normal. No respiratory distress.     Breath sounds: Normal breath sounds. No decreased breath sounds, wheezing, rhonchi or rales.  Abdominal:     General: Bowel sounds are normal.     Palpations: Abdomen is soft.     Tenderness: There is no abdominal tenderness.  Musculoskeletal:        General: No swelling.     Cervical back: Neck supple.     Right upper leg: Normal.     Left upper leg: Normal.     Right knee: Swelling present. No deformity, effusion, erythema or ecchymosis. Decreased range of motion (Due to pain). Tenderness present.     Left knee: Normal.     Right lower leg: Swelling and tenderness (Right lower leg) present. 2+ Edema present.     Left lower leg: Swelling present. 2+ Edema present.     Right ankle: Swelling present. No tenderness. Normal range of motion.     Left ankle: Swelling present. No tenderness. Normal range of motion.     Comments: Positive Homans' sign on right lower leg.  Right leg is swollen larger than left leg.  Leg measurements: Right thigh above the knee: 58 cm.  Right lower leg below the knee: 46.5 cm.  Right ankle 26 cm.  Left thigh above the knee: 55 cm.  Left lower leg below the knee: 43 cm.  Left ankle 26.  Bilateral lower legs with hyperpigmentation of stasis dermatitis.  Lymphadenopathy:     Head:     Right side of head: No submental, submandibular, tonsillar, preauricular or posterior auricular adenopathy.     Left side of head: No submental, submandibular, tonsillar, preauricular or posterior auricular adenopathy.     Cervical: No cervical adenopathy.     Right cervical: No superficial cervical adenopathy.    Left cervical: No superficial cervical adenopathy.  Skin:    General: Skin is warm  and dry.     Capillary Refill: Capillary refill takes less than 2 seconds.     Findings: No rash.  Neurological:     Mental Status: She is alert and oriented to person, place, and time.  Psychiatric:        Mood and Affect: Mood normal.      UC Treatments / Results  Labs (all labs ordered are listed, but only abnormal results are displayed) Comprehensive Metabolic Panel: 10/21/23: Order: [id number] Component Ref Range & Units 4 mo ago  Sodium 136 - 145 mmol/L 140  Potassium 3.5 - 5.1 mmol/L 4.8  Comment: NO VISIBLE HEMOLYSIS  Chloride 98 - 107 mmol/L 105  CO2 21 - 31 mmol/L 27  Anion Gap 6 - 14 mmol/L 8  Glucose, Random 70 - 99 mg/dL 73  Blood Urea Nitrogen (BUN) 7 - 25 mg/dL 11  Creatinine 9.39 - 8.79 mg/dL 9.42 Low   eGFR >40 fO/fpw/8.26f7 >90  Comment: GFR estimated by CKD-EPI equations(NKF 2021).  Recommend confirmation of Cr-based eGFR by using Cys-based eGFR and other filtration markers (if applicable) in complex cases and clinical decision-making, as needed.  Albumin 3.5 - 5.7 g/dL 3.7  Total Protein 6.4 - 8.9 g/dL 7.5  Bilirubin, Total 0.3 - 1.0 mg/dL 0.4  Alkaline Phosphatase (ALP) 34 - 104 U/L 64  Aspartate Aminotransferase (AST) 13 - 39 U/L 12 Low   Alanine Aminotransferase (ALT) 7 -  52 U/L 15  Calcium 8.6 - 10.3 mg/dL 9.3  BUN/Creatinine Ratio 10.0 - 20.0 19.3  Resulting Agency [facility](CLIA# [id number])    EKG   Radiology DG Knee Complete 4 Views Right Result Date: 03/01/2024 CLINICAL DATA:  Right leg swelling and pain, knee pain EXAM: RIGHT KNEE - COMPLETE 4+ VIEW COMPARISON:  None Available. FINDINGS: Normal alignment without acute osseous finding, fracture or large effusion. Mild right knee tricompartmental osteoarthritis with slight joint space loss, sclerosis and bony spurring. Obese body habitus. Patella is located. IMPRESSION: Mild right knee tricompartmental osteoarthritis. No acute finding by plain  radiography. Electronically Signed   By: CHRISTELLA.  Shick M.D.   On: 03/01/2024 16:16   US  Venous Img Lower Unilateral Right Result Date: 03/01/2024 CLINICAL DATA:  Pain and swelling EXAM: RIGHT LOWER EXTREMITY VENOUS DOPPLER ULTRASOUND TECHNIQUE: Gray-scale sonography with graded compression, as well as color Doppler and duplex ultrasound were performed to evaluate the lower extremity deep venous systems from the level of the common femoral vein and including the common femoral, femoral, profunda femoral, popliteal and calf veins including the posterior tibial, peroneal and gastrocnemius veins when visible. The superficial great saphenous vein was also interrogated. Spectral Doppler was utilized to evaluate flow at rest and with distal augmentation maneuvers in the common femoral, femoral and popliteal veins. COMPARISON:  None Available. FINDINGS: Contralateral Common Femoral Vein: Not visualized because of body habitus Common Femoral Vein: Not visualized because of body habitus Saphenofemoral Junction: Not visualized because of body habitus Profunda Femoral Vein: Not visualized because of body habitus Femoral Vein: No evidence of thrombus. Normal compressibility, respiratory phasicity and response to augmentation. Popliteal Vein: No evidence of thrombus. Normal compressibility, respiratory phasicity and response to augmentation. Calf Veins: No evidence of thrombus. Normal compressibility and flow on color Doppler imaging. Superficial Great Saphenous Vein: No evidence of thrombus. Normal compressibility. Exam is limited because of the large body habitus and pannus. The common femoral, saphenofemoral junction, and profunda femoral veins therefore cannot be visualized. IMPRESSION: Limited exam. See above comment. No significant femoropopliteal or calf DVT. Electronically Signed   By: CHRISTELLA.  Shick M.D.   On: 03/01/2024 16:15    Procedures Procedures (including critical care time)  Medications Ordered in UC Medications  - No data to display  Initial Impression / Assessment and Plan / UC Course  I have reviewed the triage vital signs and the nursing notes.  Pertinent labs & imaging results that were available during my care of the patient were reviewed by me and considered in my medical decision making (see chart for details).  Plan of Care: Knee pain and right leg pain: X-rays showed osteoarthritis of the right knee.  Ultrasound is negative for DVT.  Meloxicam  15 mg daily for arthritis.  Patient tolerates meloxicam  even though she has an ibuprofen allergy.  Encouraged RICE therapy.  Provided a right knee brace.  Follow-up as needed.  I reviewed the plan of care with the patient and/or the patient's guardian.  The patient and/or guardian had time to ask questions and acknowledged that the questions were answered.  I provided instruction on symptoms or reasons to return here or to go to an ER, if symptoms/condition did not improve, worsened or if new symptoms occurred.  Final Clinical Impressions(s) / UC Diagnoses   Final diagnoses:  Acute pain of right knee  Pain in right lower leg  Localized swelling of right lower leg  Osteoarthritis of right knee, unspecified osteoarthritis type  Discharge Instructions      Knee pain and right leg pain: Ultrasound was negative for any blood clots.  Knee x-ray showed osteoarthritis.  Meloxicam  15 mg 1 pill daily for arthritis.  Patient has an ibuprofen allergy but reports she is taking meloxicam  without difficulty.  Encouraged rest, ice, compression, elevation.  Follow-up as needed.  May need to see orthopedics.     ED Prescriptions     Medication Sig Dispense Auth. Provider   meloxicam  (MOBIC ) 15 MG tablet Take 1 tablet (15 mg total) by mouth daily as needed for pain (knee pain). 30 tablet Zivah Mayr, FNP      PDMP not reviewed this encounter.   Ival Domino, FNP 03/01/24 1705

## 2024-03-01 NOTE — ED Triage Notes (Signed)
 Pt c/o of right leg pain that started 2 weeks ago. She feels a tightness to the back of her right knee that causes the pain to radiate to the front of her knee then radiated down to her ankle. Pt denies known injury or swelling. When she walks she feels like her leg is heavy. Pt has taken BC powder, tylenol , and Advil with no relief. Pain is worst when she is walking or siting in a chair with her knee bent.
